# Patient Record
Sex: Female | Born: 1970 | Race: Asian | Hispanic: No | Marital: Married | Smoking: Never smoker
Health system: Southern US, Community
[De-identification: ages and names within clinical notes are randomized; demographics above are authoritative.]

## PROBLEM LIST (undated history)

## (undated) DIAGNOSIS — Z862 Personal history of diseases of the blood and blood-forming organs and certain disorders involving the immune mechanism: Secondary | ICD-10-CM

## (undated) DIAGNOSIS — I10 Essential (primary) hypertension: Secondary | ICD-10-CM

## (undated) DIAGNOSIS — Z8619 Personal history of other infectious and parasitic diseases: Secondary | ICD-10-CM

## (undated) HISTORY — DX: Personal history of other infectious and parasitic diseases: Z86.19

## (undated) HISTORY — DX: Personal history of diseases of the blood and blood-forming organs and certain disorders involving the immune mechanism: Z86.2

## (undated) HISTORY — DX: Essential (primary) hypertension: I10

---

## 2003-09-25 ENCOUNTER — Other Ambulatory Visit: Admission: RE | Admit: 2003-09-25 | Discharge: 2003-09-25 | Payer: Self-pay | Admitting: Family Medicine

## 2003-10-06 ENCOUNTER — Encounter: Admission: RE | Admit: 2003-10-06 | Discharge: 2003-10-06 | Payer: Self-pay | Admitting: Family Medicine

## 2007-11-30 ENCOUNTER — Ambulatory Visit: Payer: Self-pay | Admitting: Obstetrics and Gynecology

## 2007-11-30 ENCOUNTER — Encounter: Payer: Self-pay | Admitting: Obstetrics and Gynecology

## 2007-12-13 ENCOUNTER — Encounter: Admission: RE | Admit: 2007-12-13 | Discharge: 2007-12-13 | Payer: Self-pay | Admitting: Gynecology

## 2008-07-04 ENCOUNTER — Encounter: Admission: RE | Admit: 2008-07-04 | Discharge: 2008-07-04 | Payer: Self-pay | Admitting: Obstetrics and Gynecology

## 2008-08-03 ENCOUNTER — Inpatient Hospital Stay (HOSPITAL_COMMUNITY): Admission: AD | Admit: 2008-08-03 | Discharge: 2008-08-03 | Payer: Self-pay | Admitting: Obstetrics and Gynecology

## 2008-08-04 ENCOUNTER — Inpatient Hospital Stay (HOSPITAL_COMMUNITY): Admission: AD | Admit: 2008-08-04 | Discharge: 2008-08-05 | Payer: Self-pay | Admitting: Obstetrics and Gynecology

## 2010-06-02 ENCOUNTER — Encounter: Payer: Self-pay | Admitting: Family Medicine

## 2010-08-22 LAB — RPR: RPR Ser Ql: NONREACTIVE

## 2010-08-22 LAB — CBC
Hemoglobin: 9 g/dL — ABNORMAL LOW (ref 12.0–15.0)
MCHC: 33.3 g/dL (ref 30.0–36.0)
Platelets: 123 10*3/uL — ABNORMAL LOW (ref 150–400)
Platelets: 148 10*3/uL — ABNORMAL LOW (ref 150–400)
RDW: 15 % (ref 11.5–15.5)
RDW: 15.2 % (ref 11.5–15.5)
WBC: 10.2 10*3/uL (ref 4.0–10.5)

## 2010-09-24 NOTE — Assessment & Plan Note (Signed)
NAME:  Sandra Huffman, Sandra Huffman                     ACCOUNT NO.:  [id number]   MEDICAL RECORD NO.:  [id number]          PATIENT TYPE:  POB   LOCATION:  [facility]         FACILITY:  [facility]   PHYSICIAN:  Argentina Donovan, MD        DATE OF BIRTH:  07-26-1970   DATE OF SERVICE:  11/30/2007                                  CLINIC NOTE   The patient is around 40 year old [name] female (the dates are not  certain because the father gave an earlier date when they came to the  [place] in order that she might go to school).  Gravida 1, para 1-  0-0-1, who had a daughter 48 years old who passed away last year.  Three  years ago, she had a small right breast nodule and had an ultrasound at  [facility] at which time she was reassured.  She returns today with a complaint of another breast nodule that she  thinks she feels in the right breast.  In addition, she has not had a  Pap smear in many years.  She has no significant complaints.  Her  periods are mildly heavy and for that she is taking iron, and she had  her last CBC about 6 months ago with her general doctor.   On examination of the breasts, they are symmetrical.  No nipple  discharge.  However, on the right approximately at 2 o'clock just  adjacent to the areola of the right nipple, there seems to be a slight  palpable, not discrete change in the consistency of the tissue perhaps  1.5 cm in diameter.  It is difficult for me to even say this is an  abnormality.  The abdomen is soft, flat, and nontender.  No masses.  No  organomegaly.  Genitalia, externally is normal.  BUS within normal  limits.  Vagina is clean and well rugated.  Cervix is clean and parous  and the uterus is small, anterior, and of normal size, shape, and  consistency with normal adnexa.   I am going to have the patient get another mammogram; it has been 3  years since her last one, so they can compare that to the present one.  I have told her at  the present time, I do not think she has anything  significant; however, I would certainly check it at least once a month  probably in the shower or as soon as  she got out.   IMPRESSION:  Normal gynecological examination with the exception of the  aforementioned finding in the right breast.           ______________________________  Argentina Donovan, MD     PR/MEDQ  D:  11/30/2007  T:  12/01/2007  Job:  [id number]

## 2010-09-27 NOTE — Discharge Summary (Signed)
NAME:  Sandra Huffman, Sandra Huffman                     ACCOUNT NO.:  [id number]   MEDICAL RECORD NO.:  [id number]          PATIENT TYPE:  INP   LOCATION:  9109                          FACILITY:  [facility]   PHYSICIAN:  Zenaida Niece, M.D.DATE OF BIRTH:  06-20-1970   DATE OF ADMISSION:  08/04/2008  DATE OF DISCHARGE:  08/05/2008                               DISCHARGE SUMMARY   ADMISSION DIAGNOSES:  1. Intrauterine pregnancy at 38+ weeks.  2. Gestational diabetes.   DISCHARGE DIAGNOSES:  1. Intrauterine pregnancy at 38+ weeks.  2. Gestational diabetes.   PROCEDURES:  On August 04, 2008, Dr. Ellyn Hack performed a spontaneous  vaginal delivery.   HISTORY AND PHYSICAL:  This is a 40 year old gravida 3, para 1-0-1-0  with an EGA of 38+ weeks who presents complaining of regular  contractions.  Prenatal care complicated by advanced maternal age and  diet-controlled gestational diabetes.   PRENATAL LABORATORIES:  Blood type is B positive with negative antibody  screen.  Gonorrhea  and chlamydia negative.  RPR nonreactive.  Rubella  immune.  Hepatitis B surface antigen negative.  HIV negative.  Cystic  fibrosis negative.  One-hour Glucola 198.  Three-hour GTT 72, 26, 170,  and 138.  First trimester screen and MSAFP are normal and group B strep  is negative.   PAST OB HISTORY:  Vaginal delivery at term in 1995 and that child  suicide 3 years ago, 1 spontaneous abortion.   PAST MEDICAL HISTORY:  History of depression and anemia.   PHYSICAL EXAMINATION:  She is afebrile with stable vital signs.  Abdomen  soft.  Fundus is nontender.  Fetal heart rate is in the 130s with  contractions every 4 minutes.  Cervix on Dr. Emeline Darling first exam is 7,  90, 0 to +1, and membranes were ruptured revealing clear fluid.   HOSPITAL COURSE:  The patient was admitted and continued to contract on  her own.  She had membranes ruptured for augmentation.  She continued to  progress to complete, pushed well, and on the morning of  August 04, 2008,  had a vaginal delivery of a viable female infant with Apgars of 3, 6,  and 9 that weighed 6 pounds and 9 ounces.  There was a loose nuchal cord  x1.  Placenta was expressed intact.  She had a second-degree perineal  laceration repaired with 3-0 Vicryl and estimated blood loss was less  than 500 mL.  Postpartum, she had no significant problems.  Pre-delivery  hemoglobin 10.9, post-delivery 9.0.  On postpartum day #1, the patient  requested discharge home.  The baby was felt to be stable, so she was  discharged home that afternoon.  She did receive the Tdap vaccine prior  to discharge.   DISCHARGE INSTRUCTIONS:  Regular diet, pelvic rest, follow up in 6  weeks.   MEDICATIONS:  Percocet #20, 1-2 p.o. q.4-6 h. p.r.n. pain and over-the-  counter ibuprofen as needed, and she is given our discharge pamphlet.      Zenaida Niece, M.D.  Electronically Signed     TDM/MEDQ  D:  08/16/2008  T:  08/16/2008  Job:  [id number]

## 2011-06-30 ENCOUNTER — Other Ambulatory Visit: Payer: Self-pay | Admitting: Family Medicine

## 2011-06-30 DIAGNOSIS — Z1231 Encounter for screening mammogram for malignant neoplasm of breast: Secondary | ICD-10-CM

## 2011-07-01 ENCOUNTER — Encounter: Payer: Self-pay | Admitting: Family Medicine

## 2011-07-01 ENCOUNTER — Ambulatory Visit (INDEPENDENT_AMBULATORY_CARE_PROVIDER_SITE_OTHER): Payer: BC Managed Care – PPO | Admitting: Family Medicine

## 2011-07-01 DIAGNOSIS — D649 Anemia, unspecified: Secondary | ICD-10-CM | POA: Insufficient documentation

## 2011-07-01 DIAGNOSIS — R079 Chest pain, unspecified: Secondary | ICD-10-CM | POA: Insufficient documentation

## 2011-07-01 DIAGNOSIS — Z862 Personal history of diseases of the blood and blood-forming organs and certain disorders involving the immune mechanism: Secondary | ICD-10-CM

## 2011-07-01 NOTE — Assessment & Plan Note (Addendum)
Unclear etiology. Does not sound cardiac in nature.  However doesn't sound MSK either as not reproducible on exam today.  Currently no significant pain. ?if due to lifting 40yo daughter (MSK). Check EKG today to get baseline, r/o arrhythmia although regular rhythm on exam. Will request records from [facility] as pt states recently had blood work done there to check anemia and iron and normal. has mammogram scheduled for next month, well woman with OBGYN. If unable to attain records, will have pt return for FLP, CMP, CBC and iron panel.  H/o IDA. EKG - NSR rate 70, normal axis, intervals, no hypertrophy or acute ST/T changes.  one ~1.24 sec pause (<2 sec).

## 2011-07-01 NOTE — Assessment & Plan Note (Addendum)
Review recent records/labs

## 2011-07-01 NOTE — Patient Instructions (Addendum)
I will request records from [facility] to review.  We will call you if we want any other blood work.   EKG today. Good to meet you today, call us with questions. We'll keep an eye on this, please return if worsening or not improving as expected. Try tylenol for discomfort.  Ensure staying well hydrated with plenty of fluid.

## 2011-07-01 NOTE — Progress Notes (Signed)
Subjective:    Patient ID: Sandra Huffman, female    DOB: Sep 29, 1970, 41 y.o.   MRN: [id number]  HPI CC: new pt, establish  Went to [facility] last month for chest pain.  cxr obtained and told normal.  Treated with zpack.  Tends to lift 3 yo daughter above left shoulder.  Pain described as soreness throughout chest associated with mild sharp pains intermittently with deep breaths as well as right arm soreness.  Staying sore for last 3 months.  No h/o asthma.  Not exhertional, not relieved by rest.  No recent viral infections.  No nausea or diaphoresis with this.  No HA, dizziness, heart palpitations or tachycardia associated with this.  May have felt like this when had low iron anemia in past.  H/o IDA.  Stopped iron 3 yrs ago.  BP up today. No h/o HTN.  No fmhx colon or prostate cancer or breast cancer.  No fmhx heart problems.  Preventative: Well woman with OBGYN at [facility].  Normal pap.  Has appt for mammogram next month. Last blood work was 1-2 mo ago at [facility]. Flu 201. Tetanus unsure. LMP - 06/24/2011, regular.  Not on OCP.  Not heavy bleeding.  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  Past histories reviewed and updated if relevant as below. There is no problem list on file for this patient.  Past Medical History  Diagnosis Date  . History of chicken pox   . History of iron deficiency anemia    No past surgical history on file. History  Substance Use Topics  . Smoking status: Never Smoker   . Smokeless tobacco: Never Used  . Alcohol Use: No   Family History  Problem Relation Age of Onset  . Cancer Mother 7    lung, chewing tobacco  . Hyperlipidemia Father   . Coronary artery disease Neg Hx   . Stroke Neg Hx   . Diabetes Neg Hx    No Known Allergies No current outpatient prescriptions on  file prior to visit.   Review of Systems  Constitutional: Negative for fever, chills, activity change, appetite change, fatigue and unexpected weight change.  HENT: Negative for hearing loss and neck pain.   Eyes: Negative for visual disturbance.  Respiratory: Negative for cough, chest tightness, shortness of breath and wheezing.   Cardiovascular: Positive for chest pain. Negative for palpitations and leg swelling.  Gastrointestinal: Positive for diarrhea. Negative for nausea, vomiting, abdominal pain, constipation, blood in stool and abdominal distention.  Genitourinary: Negative for hematuria and difficulty urinating.  Musculoskeletal: Negative for myalgias and arthralgias.  Skin: Negative for rash.  Neurological: Negative for dizziness, seizures, syncope and headaches.  Hematological: Does not bruise/bleed easily.  Psychiatric/Behavioral: Negative for dysphoric mood. The patient is not nervous/anxious.        Objective:   Physical Exam  Nursing note and vitals reviewed. Constitutional: She is oriented to person, place, and time. She appears well-developed and well-nourished. No distress.  HENT:  Head: Normocephalic and atraumatic.  Right Ear: Hearing, tympanic membrane, external ear and ear canal normal.  Left Ear: Hearing, tympanic membrane, external ear and ear canal normal.  Nose: Nose normal. No mucosal edema or rhinorrhea.  Mouth/Throat: Uvula is midline, oropharynx is clear and moist and mucous membranes are normal. No oropharyngeal exudate, posterior oropharyngeal edema, posterior oropharyngeal erythema or  tonsillar abscesses.  Eyes: Conjunctivae and EOM are normal. Pupils are equal, round, and reactive to light.  Neck: Normal range of motion. Neck supple. No thyromegaly present.  Cardiovascular: Normal rate, regular rhythm, normal heart sounds and intact distal pulses.   No murmur heard. Pulses:      Radial pulses are 2+ on the right side, and 2+ on the left side.    Pulmonary/Chest: Effort normal and breath sounds normal. No respiratory distress. She has no wheezes. She has no rales. She exhibits no mass, no tenderness and no bony tenderness.       Pain not reproducible with palpation.  Abdominal: Soft. Bowel sounds are normal. She exhibits no distension and no mass. There is no tenderness. There is no rebound and no guarding.  Musculoskeletal: Normal range of motion. She exhibits no edema.  Lymphadenopathy:    She has no cervical adenopathy.  Neurological: She is alert and oriented to person, place, and time.       CN grossly intact, station and gait intact  Skin: Skin is warm and dry. No rash noted.  Psychiatric: She has a normal mood and affect. Her behavior is normal. Judgment and thought content normal.      Assessment & Plan:

## 2011-07-09 ENCOUNTER — Ambulatory Visit (INDEPENDENT_AMBULATORY_CARE_PROVIDER_SITE_OTHER): Payer: BC Managed Care – PPO | Admitting: Family Medicine

## 2011-07-09 ENCOUNTER — Encounter: Payer: Self-pay | Admitting: Family Medicine

## 2011-07-09 ENCOUNTER — Telehealth: Payer: Self-pay | Admitting: Radiology

## 2011-07-09 ENCOUNTER — Telehealth: Payer: Self-pay | Admitting: *Deleted

## 2011-07-09 VITALS — BP 160/100 | HR 84 | Temp 98.2°F | Wt 133.0 lb

## 2011-07-09 DIAGNOSIS — I160 Hypertensive urgency: Secondary | ICD-10-CM | POA: Insufficient documentation

## 2011-07-09 DIAGNOSIS — I10 Essential (primary) hypertension: Secondary | ICD-10-CM

## 2011-07-09 DIAGNOSIS — J029 Acute pharyngitis, unspecified: Secondary | ICD-10-CM | POA: Insufficient documentation

## 2011-07-09 LAB — BASIC METABOLIC PANEL
BUN: 7 mg/dL (ref 6–23)
CO2: 25 mEq/L (ref 19–32)
Calcium: 8.6 mg/dL (ref 8.4–10.5)
Chloride: 105 mEq/L (ref 96–112)
GFR: 115.43 mL/min (ref >60.00)
Sodium: 136 mEq/L (ref 135–145)

## 2011-07-09 MED ORDER — POTASSIUM CHLORIDE CRYS ER 20 MEQ PO TBCR: EXTENDED_RELEASE_TABLET | ORAL | Status: DC

## 2011-07-09 MED ORDER — HYDROCHLOROTHIAZIDE 12.5 MG PO TABS: 12.5000 mg | ORAL_TABLET | Freq: Every day | ORAL | Status: DC

## 2011-07-09 MED ORDER — SPIRONOLACTONE 25 MG PO TABS: 25.0000 mg | ORAL_TABLET | Freq: Every day | ORAL | Status: DC

## 2011-07-09 NOTE — Telephone Encounter (Signed)
See result note.  

## 2011-07-09 NOTE — Telephone Encounter (Signed)
I called in 10 of the 20 meq's because I couldn't find 40 meq's in the list of choices.

## 2011-07-09 NOTE — Patient Instructions (Signed)
Great to see you. I think you have a virus- use Ibuprofen 400-600 mg every 8 hours as needed for pain. We are starting HCTZ(hydrochlorothiazide) for blood pressure- 1 tablet every morning. Try to cut back on salt intake. Make appointment to see Dr. Reece Agar in 2 weeks.

## 2011-07-09 NOTE — Progress Notes (Signed)
Subjective:    Patient ID: Sandra Huffman, female    DOB: [birth year], 41 y.o.   MRN: [id number]  HPI 41 yo pt of Dr. Reece Agar (established with him last week) here for fever and sore throat.  Pharyngitis- acute onset of sore throat yesterday.  Hurts to swallow.  Had fever last night, T max 102 with body aches.  Afebrile this morning, has not had any Tylenol or Ibuprofen. No known sick contacts.  Has a HA this morning.  No abdominal pain, nausea or vomiting.  HTN- BP very elevated today and was elevated but not as high at last office visit. BP Readings from Last 3 Encounters:  07/09/11 160/100  07/01/11 146/100  Did have CP when she established last week but that has resolved (EKG reassuring at last OV). Has frontal HA but no blurred vision. Admits to salty diet and pos FH of HTN in her father. Has never taken any antihypertensives.  Patient Active Problem List  Diagnoses  . Chest pain  . History of iron deficiency anemia  . HTN (hypertension)  . Pharyngitis   Past Medical History  Diagnosis Date  . History of chicken pox   . History of iron deficiency anemia    No past surgical history on file. History  Substance Use Topics  . Smoking status: Never Smoker   . Smokeless tobacco: Never Used  . Alcohol Use: No   Family History  Problem Relation Age of Onset  . Cancer Mother 22    lung, chewing tobacco  . Hyperlipidemia Father   . Coronary artery disease Neg Hx   . Stroke Neg Hx   . Diabetes Neg Hx    No Known Allergies No current outpatient prescriptions on file prior to visit.   Review of Systems  See HPI No CP, no SOB. Does have a HA No blurred vision      Objective:   Physical Exam  BP 160/100  Pulse 84  Temp(Src) 98.2 F (36.8 C) (Oral)  Wt 133 lb (60.328 kg)  LMP 06/22/2011  Constitutional: She is oriented to person, place, and time. She appears well-developed and well-nourished. No distress.  HENT:  Head: Normocephalic and atraumatic.  Right Ear: Hearing,  tympanic membrane, external ear and ear canal normal.  Left Ear: Hearing, tympanic membrane, external ear and ear canal normal.  Nose: Nose normal. No mucosal edema or rhinorrhea.  Mouth/Throat: Uvula is midline, oropharynx is clear and moist and mucous membranes are normal. + erythema, No oropharyngeal exudate, posterior oropharyngeal edema, posterior oropharyngeal erythema or tonsillar abscesses.  Eyes: Conjunctivae and EOM are normal. Pupils are equal, round, and reactive to light.  Neck: Normal range of motion. Neck supple. No thyromegaly present.  Cardiovascular: Normal rate, regular rhythm, normal heart sounds and intact distal pulses.   No murmur heard. Lymphadenopathy:    She has no cervical adenopathy.  Skin: Skin is warm and dry. No rash noted.  Psychiatric: She has a normal mood and affect. Her behavior is normal. Judgment and thought content normal.      Assessment & Plan:   1. HTN (hypertension)  New- has not taken any decongestants. May be a bit more elevated due to acute illness but given that it was elevated last week as well, will go ahead and start HCTZ 12.5 mg daily and check BMET.  Follow up with Dr. Reece Agar in 2 weeks.  Discussed low salt diet. Basic Metabolic Panel  2. Pharyngitis  New- rapid strep neg. Likely viral and advised  supportive care- see pt instructions for details.

## 2011-07-09 NOTE — Telephone Encounter (Signed)
Dr. Dayton Martes, how many potassium tabs to you want to give patient?

## 2011-07-09 NOTE — Telephone Encounter (Signed)
Let's give her 5 but only take for 3 days prior to recheck of BMet on Friday.  Sorry I did not specify. Thanks!

## 2011-07-09 NOTE — Telephone Encounter (Signed)
[facility] called a critical K+ 2.7

## 2011-07-11 ENCOUNTER — Other Ambulatory Visit: Payer: BC Managed Care – PPO

## 2011-07-11 DIAGNOSIS — E876 Hypokalemia: Secondary | ICD-10-CM

## 2011-07-11 LAB — BASIC METABOLIC PANEL
CO2: 25 mEq/L (ref 19–32)
Calcium: 8.7 mg/dL (ref 8.4–10.5)
Creat: 0.51 mg/dL (ref 0.50–1.10)
Potassium: 3.5 mEq/L (ref 3.5–5.3)
Sodium: 138 mEq/L (ref 135–145)

## 2011-07-25 ENCOUNTER — Ambulatory Visit (INDEPENDENT_AMBULATORY_CARE_PROVIDER_SITE_OTHER): Payer: BC Managed Care – PPO | Admitting: Family Medicine

## 2011-07-25 ENCOUNTER — Encounter: Payer: Self-pay | Admitting: Family Medicine

## 2011-07-25 VITALS — BP 150/100 | HR 80 | Temp 98.1°F | Ht 60.0 in | Wt 132.8 lb

## 2011-07-25 DIAGNOSIS — I10 Essential (primary) hypertension: Secondary | ICD-10-CM

## 2011-07-25 MED ORDER — LISINOPRIL 20 MG PO TABS: 20.0000 mg | ORAL_TABLET | Freq: Every day | ORAL | Status: DC

## 2011-07-25 NOTE — Patient Instructions (Signed)
Please stop taking your spironolactone. Start taking Lisinopril 20 mg daily. Please come back in 2 weeks to recheck your blood pressure.

## 2011-07-25 NOTE — Progress Notes (Signed)
Subjective:    Patient ID: Sandra Huffman, female    DOB: 05/08/1971, 41 y.o.   MRN: [id number]  HPI 41 yo pt of Dr. Reece Agar whom I saw two weeks ago and diagnosed with HTN here for follow up.  We initially started her on HCTZ but BMET drawn and potassium 2.7 so was switched to spironolactone 25 mg. Potassium repleted with kdur 40 meq x 3 days. Lab Results  Component Value Date   NA 138 07/11/2011   K 3.5 07/11/2011   CL 105 07/11/2011   CO2 25 07/11/2011    BP remains very high.  No noticible side effects with spironolactone.   Patient Active Problem List  Diagnoses  . Chest pain  . History of iron deficiency anemia  . HTN (hypertension)  . Pharyngitis   Past Medical History  Diagnosis Date  . History of chicken pox   . History of iron deficiency anemia    No past surgical history on file. History  Substance Use Topics  . Smoking status: Never Smoker   . Smokeless tobacco: Never Used  . Alcohol Use: No   Family History  Problem Relation Age of Onset  . Cancer Mother 80    lung, chewing tobacco  . Hyperlipidemia Father   . Coronary artery disease Neg Hx   . Stroke Neg Hx   . Diabetes Neg Hx    No Known Allergies Current Outpatient Prescriptions on File Prior to Visit  Medication Sig Dispense Refill  . hydrochlorothiazide (HYDRODIURIL) 12.5 MG tablet Take 1 tablet (12.5 mg total) by mouth daily.  30 tablet  0  . potassium chloride SA (K-DUR,KLOR-CON) 20 MEQ tablet Take 2 by mouth daily.  10 tablet  0  . spironolactone (ALDACTONE) 25 MG tablet Take 1 tablet (25 mg total) by mouth daily.  30 tablet  0   Review of Systems  See HPI No CP, no SOB. Does have a HA No blurred vision      Objective:   Physical Exam  LMP 06/22/2011 BP 150/100  Pulse 80  Temp(Src) 98.1 F (36.7 C) (Oral)  Ht 5' (1.524 m)  Wt 132 lb 12.8 oz (60.238 kg)  BMI 25.94 kg/m2  SpO2 98%  LMP 06/22/2011 BP Readings from Last 3 Encounters:  07/25/11 150/100  07/09/11 160/100  07/01/11 146/100     Constitutional: She is oriented to person, place, and time. She appears well-developed and well-nourished. No distress.  HENT:  Head: Normocephalic and atraumatic.  Right Ear: Hearing, tympanic membrane, external ear and ear canal normal.  Left Ear: Hearing, tympanic membrane, external ear and ear canal normal.  Nose: Nose normal. No mucosal edema or rhinorrhea.  Mouth/Throat: Uvula is midline, oropharynx is clear and moist and mucous membranes are normal. + erythema, No oropharyngeal exudate, posterior oropharyngeal edema, posterior oropharyngeal erythema or tonsillar abscesses.  Eyes: Conjunctivae and EOM are normal. Pupils are equal, round, and reactive to light.  Neck: Normal range of motion. Neck supple. No thyromegaly present.  Cardiovascular: Normal rate, regular rhythm, normal heart sounds and intact distal pulses.   No murmur heard. Lymphadenopathy:    She has no cervical adenopathy.  Skin: Skin is warm and dry. No rash noted.  Psychiatric: She has a normal mood and affect. Her behavior is normal. Judgment and thought content normal.      Assessment & Plan:    1. HTN (hypertension)  Basic metabolic panel   Remains poorly controlled. >25 min spent with face to face with patient, >  50% counseling and/or coordinating care. I would prefer not to increase spirolactone given side effects and her limited response to current dose. Cannot tolerate HCTZ due to hypokalemia. Will d/c spironolactone, start lisinopril 20 mg daily(does not want to have more children). Follow up in 2 weeks.

## 2011-07-26 LAB — BASIC METABOLIC PANEL
CO2: 24 mEq/L (ref 19–32)
Calcium: 8.9 mg/dL (ref 8.4–10.5)
Creat: 0.74 mg/dL (ref 0.50–1.10)
Glucose, Bld: 83 mg/dL (ref 70–99)
Sodium: 137 mEq/L (ref 135–145)

## 2011-08-04 ENCOUNTER — Ambulatory Visit: Payer: Self-pay

## 2011-08-07 ENCOUNTER — Ambulatory Visit
Admission: RE | Admit: 2011-08-07 | Discharge: 2011-08-07 | Disposition: A | Discharge status: Home / Self Care | Payer: BC Managed Care – PPO | Source: Ambulatory Visit | Attending: Family Medicine | Admitting: Family Medicine

## 2011-08-07 DIAGNOSIS — Z1231 Encounter for screening mammogram for malignant neoplasm of breast: Secondary | ICD-10-CM

## 2011-08-11 ENCOUNTER — Other Ambulatory Visit: Payer: Self-pay | Admitting: Family Medicine

## 2011-08-11 DIAGNOSIS — R928 Other abnormal and inconclusive findings on diagnostic imaging of breast: Secondary | ICD-10-CM

## 2011-08-14 ENCOUNTER — Ambulatory Visit (INDEPENDENT_AMBULATORY_CARE_PROVIDER_SITE_OTHER): Payer: BC Managed Care – PPO | Admitting: Family Medicine

## 2011-08-14 ENCOUNTER — Encounter: Payer: Self-pay | Admitting: Family Medicine

## 2011-08-14 VITALS — BP 120/80 | HR 80 | Temp 97.9°F | Wt 136.0 lb

## 2011-08-14 DIAGNOSIS — I10 Essential (primary) hypertension: Secondary | ICD-10-CM

## 2011-08-14 DIAGNOSIS — E876 Hypokalemia: Secondary | ICD-10-CM

## 2011-08-14 NOTE — Progress Notes (Signed)
  Subjective:    Patient ID: Sandra Huffman, female    DOB: 15-Aug-1970, 41 y.o.   MRN: [id number]  HPI 41 yo pt of Dr. Reece Agar here for follow up HTN.  We initially started her on HCTZ but BMET drawn and potassium 2.7 so was switched to spironolactone 25 mg. Potassium repleted with kdur 40 meq x 3 days but BP was not responsive to spironolactone.  D/c's spironolactone and now taking Lisinopril 20 mg daily.  Lab Results  Component Value Date   NA 137 07/25/2011   K 3.9 07/25/2011   CL 102 07/25/2011   CO2 24 07/25/2011    Feels much better!  No CP, No HA, no SOB. No side effects.   Patient Active Problem List  Diagnoses  . Chest pain  . History of iron deficiency anemia  . HTN (hypertension)  . Pharyngitis  . Hypokalemia   Past Medical History  Diagnosis Date  . History of chicken pox   . History of iron deficiency anemia    No past surgical history on file. History  Substance Use Topics  . Smoking status: Never Smoker   . Smokeless tobacco: Never Used  . Alcohol Use: No   Family History  Problem Relation Age of Onset  . Cancer Mother 51    lung, chewing tobacco  . Hyperlipidemia Father   . Coronary artery disease Neg Hx   . Stroke Neg Hx   . Diabetes Neg Hx    No Known Allergies Current Outpatient Prescriptions on File Prior to Visit  Medication Sig Dispense Refill  . lisinopril (PRINIVIL,ZESTRIL) 20 MG tablet Take 1 tablet (20 mg total) by mouth daily.  30 tablet  6  . DISCONTD: hydrochlorothiazide (HYDRODIURIL) 12.5 MG tablet Take 1 tablet (12.5 mg total) by mouth daily.  30 tablet  0  . DISCONTD: potassium chloride SA (K-DUR,KLOR-CON) 20 MEQ tablet Take 2 by mouth daily.  10 tablet  0  . DISCONTD: spironolactone (ALDACTONE) 25 MG tablet Take 1 tablet (25 mg total) by mouth daily.  30 tablet  0   Review of Systems  See HPI No CP, no SOB. Does have a HA No blurred vision      Objective:   Physical Exam   BP 120/80  Pulse 80  Temp(Src) 97.9 F (36.6 C) (Oral)   Wt 136 lb (61.689 kg)  BP Readings from Last 3 Encounters:  07/25/11 150/100  07/09/11 160/100  07/01/11 146/100    Constitutional: She is oriented to person, place, and time. She appears well-developed and well-nourished. No distress.  HENT:  Head: Normocephalic and atraumatic.  Eyes: Conjunctivae and EOM are normal. Pupils are equal, round, and reactive to light.  Neck: Normal range of motion. Neck supple. No thyromegaly present.  Cardiovascular: Normal rate, regular rhythm, normal heart sounds and intact distal pulses.   No murmur heard. Lymphadenopathy:    She has no cervical adenopathy.  Skin: Skin is warm and dry. No rash noted.  Psychiatric: She has a normal mood and affect. Her behavior is normal. Judgment and thought content normal.      Assessment & Plan:    1. HTN (hypertension)    Much improved! Continue current dose of lisinopril. Follow up as needed. The patient indicates understanding of these issues and agrees with the plan.

## 2011-08-19 ENCOUNTER — Ambulatory Visit
Admission: RE | Admit: 2011-08-19 | Discharge: 2011-08-19 | Disposition: A | Discharge status: Home / Self Care | Payer: BC Managed Care – PPO | Source: Ambulatory Visit | Attending: Family Medicine | Admitting: Family Medicine

## 2011-08-19 DIAGNOSIS — R928 Other abnormal and inconclusive findings on diagnostic imaging of breast: Secondary | ICD-10-CM

## 2011-08-20 ENCOUNTER — Encounter: Payer: Self-pay | Admitting: *Deleted

## 2011-09-29 ENCOUNTER — Encounter: Payer: Self-pay | Admitting: Family Medicine

## 2011-09-29 ENCOUNTER — Ambulatory Visit (INDEPENDENT_AMBULATORY_CARE_PROVIDER_SITE_OTHER): Payer: BC Managed Care – PPO | Admitting: Family Medicine

## 2011-09-29 VITALS — BP 100/70 | HR 92 | Temp 98.3°F | Wt 133.0 lb

## 2011-09-29 DIAGNOSIS — I10 Essential (primary) hypertension: Secondary | ICD-10-CM

## 2011-09-29 DIAGNOSIS — R05 Cough: Secondary | ICD-10-CM

## 2011-09-29 DIAGNOSIS — R059 Cough, unspecified: Secondary | ICD-10-CM | POA: Insufficient documentation

## 2011-09-29 MED ORDER — AMLODIPINE BESYLATE 5 MG PO TABS: 5.0000 mg | ORAL_TABLET | Freq: Every day | ORAL | Status: DC

## 2011-09-29 NOTE — Progress Notes (Signed)
Subjective:    Patient ID: Sandra Huffman, female    DOB: [birth year], 41 y.o.   MRN: [id number]  HPI 41 yo pt of Dr. Reece Agar with h/o HTN here for cough.  Cough has been ongoing for at least a month.  At first she thought maybe it was allergies but there is no runny nose, congestion, itchy eyes. Claritin is not helping. No CP, wheezing or SOB.  HTN- We initially started her on HCTZ but BMET drawn and potassium 2.7 so was switched to spironolactone 25 mg. Potassium repleted with kdur 40 meq x 3 days but BP was not responsive to spironolactone.  D/c's spironolactone and now taking Lisinopril 20 mg daily since March.  Lab Results  Component Value Date   NA 137 07/25/2011   K 3.9 07/25/2011   CL 102 07/25/2011   CO2 24 07/25/2011       Patient Active Problem List  Diagnoses  . Chest pain  . History of iron deficiency anemia  . HTN (hypertension)  . Pharyngitis  . Hypokalemia  . Cough   Past Medical History  Diagnosis Date  . History of chicken pox   . History of iron deficiency anemia    No past surgical history on file. History  Substance Use Topics  . Smoking status: Never Smoker   . Smokeless tobacco: Never Used  . Alcohol Use: No   Family History  Problem Relation Age of Onset  . Cancer Mother 42    lung, chewing tobacco  . Hyperlipidemia Father   . Coronary artery disease Neg Hx   . Stroke Neg Hx   . Diabetes Neg Hx    No Known Allergies Current Outpatient Prescriptions on File Prior to Visit  Medication Sig Dispense Refill  . lisinopril (PRINIVIL,ZESTRIL) 20 MG tablet Take 1 tablet (20 mg total) by mouth daily.  30 tablet  6  . DISCONTD: hydrochlorothiazide (HYDRODIURIL) 12.5 MG tablet Take 1 tablet (12.5 mg total) by mouth daily.  30 tablet  0  . DISCONTD: potassium chloride SA (K-DUR,KLOR-CON) 20 MEQ tablet Take 2 by mouth daily.  10 tablet  0  . DISCONTD: spironolactone (ALDACTONE) 25 MG tablet Take 1 tablet (25 mg total) by mouth daily.  30 tablet  0   Review of  Systems  See HPI No CP, no SOB. Does have a HA No blurred vision      Objective:   Physical Exam   BP 100/70  Pulse 92  Temp(Src) 98.3 F (36.8 C) (Oral)  Wt 133 lb (60.328 kg)  LMP 09/19/2011  BP Readings from Last 3 Encounters:  08/14/11 120/80  07/25/11 150/100  07/09/11 160/100    Constitutional: She is oriented to person, place, and time. She appears well-developed and well-nourished. No distress.  HENT:  Head: Normocephalic and atraumatic.  Eyes: Conjunctivae and EOM are normal. Pupils are equal, round, and reactive to light.  Neck: Normal range of motion. Neck supple. No thyromegaly present.  Cardiovascular: Normal rate, regular rhythm, normal heart sounds and intact distal pulses.   No murmur heard. Lymphadenopathy:    She has no cervical adenopathy.  Skin: Skin is warm and dry. No rash noted.  Psychiatric: She has a normal mood and affect. Her behavior is normal. Judgment and thought content normal.      Assessment & Plan:   1. Cough  New- persistent- ? From lisinopril since she has no other symptoms of allergic rhinitis. Will d/c lisinopril. Start amlodipine 5 mg daily. Follow up in  2 weeks.  2. HTN (hypertension)  See above.

## 2011-09-29 NOTE — Patient Instructions (Signed)
Good to see you. I think the cough is from the lisinopril--stop taking it. Start taking amlodipine 5 mg daily. Follow up with me in 2 weeks.

## 2011-10-17 ENCOUNTER — Encounter: Payer: Self-pay | Admitting: Family Medicine

## 2011-10-17 ENCOUNTER — Ambulatory Visit (INDEPENDENT_AMBULATORY_CARE_PROVIDER_SITE_OTHER): Payer: BC Managed Care – PPO | Admitting: Family Medicine

## 2011-10-17 VITALS — BP 138/90 | HR 76 | Temp 98.0°F | Wt 134.0 lb

## 2011-10-17 DIAGNOSIS — I10 Essential (primary) hypertension: Secondary | ICD-10-CM

## 2011-10-17 NOTE — Progress Notes (Signed)
Subjective:    Patient ID: Sandra Huffman, female    DOB: Apr 09, 1971, 41 y.o.   MRN: [id number]  HPI 41 yo pt of Dr. Reece Agar with h/o HTN.   Developed a cough with lisinopril that had been ongoing for at least a month.  At first she thought maybe it was allergies but there is no runny nose, congestion, itchy eyes. Claritin is not helping. No CP, wheezing or SOB.  We initially started her on HCTZ but BMET drawn and potassium 2.7 so was switched to spironolactone 25 mg. Potassium repleted with kdur 40 meq x 3 days but BP was not responsive to spironolactone.  D/c's spironolactone and started lisinopril.  Due to cough, lisinopril was d/c'd and added to allergy list.  She has been taking amlodipine 5 mg daily for past 2 weeks. Cough has resolved and no adverse reactions noted. No LE edema.  Lab Results  Component Value Date   NA 137 07/25/2011   K 3.9 07/25/2011   CL 102 07/25/2011   CO2 24 07/25/2011       Patient Active Problem List  Diagnoses  . Chest pain  . History of iron deficiency anemia  . HTN (hypertension)  . Pharyngitis  . Hypokalemia  . Cough   Past Medical History  Diagnosis Date  . History of chicken pox   . History of iron deficiency anemia    No past surgical history on file. History  Substance Use Topics  . Smoking status: Never Smoker   . Smokeless tobacco: Never Used  . Alcohol Use: No   Family History  Problem Relation Age of Onset  . Cancer Mother 73    lung, chewing tobacco  . Hyperlipidemia Father   . Coronary artery disease Neg Hx   . Stroke Neg Hx   . Diabetes Neg Hx    Allergies  Allergen Reactions  . Ace Inhibitors     cough   Current Outpatient Prescriptions on File Prior to Visit  Medication Sig Dispense Refill  . amLODipine (NORVASC) 5 MG tablet Take 1 tablet (5 mg total) by mouth daily.  90 tablet  3  . DISCONTD: hydrochlorothiazide (HYDRODIURIL) 12.5 MG tablet Take 1 tablet (12.5 mg total) by mouth daily.  30 tablet  0  . DISCONTD:  potassium chloride SA (K-DUR,KLOR-CON) 20 MEQ tablet Take 2 by mouth daily.  10 tablet  0  . DISCONTD: spironolactone (ALDACTONE) 25 MG tablet Take 1 tablet (25 mg total) by mouth daily.  30 tablet  0   Review of Systems  See HPI No CP, no SOB. No blurred vision      Objective:   Physical Exam  BP 138/90  Pulse 76  Temp 98 F (36.7 C)  Wt 134 lb (60.782 kg)  LMP 09/19/2011  Constitutional: She is oriented to person, place, and time. She appears well-developed and well-nourished. No distress.  HENT:  Head: Normocephalic and atraumatic.  Eyes: Conjunctivae and EOM are normal. Pupils are equal, round, and reactive to light.  Neck: Normal range of motion. Neck supple. No thyromegaly present.  Cardiovascular: Normal rate, regular rhythm, normal heart sounds and intact distal pulses.   No murmur heard. Lymphadenopathy:    She has no cervical adenopathy.  Skin: Skin is warm and dry. No rash noted.  Psychiatric: She has a normal mood and affect. Her behavior is normal. Judgment and thought content normal.      Assessment & Plan:   1. HTN (hypertension)   Improved. Continue  amlodipine 5 mg daily. The patient indicates understanding of these issues and agrees with the plan.

## 2012-01-20 ENCOUNTER — Ambulatory Visit (INDEPENDENT_AMBULATORY_CARE_PROVIDER_SITE_OTHER): Payer: BC Managed Care – PPO | Admitting: Family Medicine

## 2012-01-20 ENCOUNTER — Encounter: Payer: Self-pay | Admitting: Family Medicine

## 2012-01-20 VITALS — BP 142/90 | Temp 98.2°F | Wt 133.2 lb

## 2012-01-20 DIAGNOSIS — R0989 Other specified symptoms and signs involving the circulatory and respiratory systems: Secondary | ICD-10-CM

## 2012-01-20 DIAGNOSIS — Z862 Personal history of diseases of the blood and blood-forming organs and certain disorders involving the immune mechanism: Secondary | ICD-10-CM

## 2012-01-20 DIAGNOSIS — I10 Essential (primary) hypertension: Secondary | ICD-10-CM

## 2012-01-20 DIAGNOSIS — R209 Unspecified disturbances of skin sensation: Secondary | ICD-10-CM

## 2012-01-20 DIAGNOSIS — R202 Paresthesia of skin: Secondary | ICD-10-CM | POA: Insufficient documentation

## 2012-01-20 DIAGNOSIS — R0600 Dyspnea, unspecified: Secondary | ICD-10-CM | POA: Insufficient documentation

## 2012-01-20 DIAGNOSIS — R0602 Shortness of breath: Secondary | ICD-10-CM

## 2012-01-20 NOTE — Patient Instructions (Addendum)
Lets try taking amlodipine in the morning. Buy a blood pressure cuff and keep track of blood pressures, especially if having shortness of breath.   Goal blood pressure is < 140/90.  If consistently staying elevated, let us know and we would likely increase amlodipine. Blood work today to check on thyroid and blood counts. Stay well hydrated and get plenty of fruits and vegetables - high [place] diet handout provided today.

## 2012-01-20 NOTE — Assessment & Plan Note (Signed)
Premenopausal, continued menstruation. Check CBC and iron panel (anemia could cause SOB)

## 2012-01-20 NOTE — Progress Notes (Signed)
  Subjective:    Patient ID: Sandra Huffman, female    DOB: 04/08/1971, 41 y.o.   MRN: [id number]  HPI CC: numbness and SOB at night.  Takes aleve prn HA about 3x/mo.  Takes amlodipine nightly, tolerating well.  Tried in AM but sometimes forgets to take.  See prior notes for recent antiHTNsive trials.  Has noted occasional SOB worse at night.  Described as trouble catching breath occasionally.  Wonders if due to taking BP med at night.  Does not have cuff at home.  No chest pain/tightness or throat pain.  No coughing, fevers/chills.   No dizziness. No prolonged travel.  No personal or fmhx blood clots.  Not on hormonal meds.  Feeling some tingling in right lower leg.  Tingling noted anterior lower thigh and leg as well as some laterally.  Never in feet or toes.  Occasional cramping in calf and right foot.  No falls/injury to leg.  No lower back pain.  No radiculopathy.  No bowel/bladder changes.  No chances of pregnancy. LMP - 2d ago.  Normal periods, regular.  3 days heavy periods.goes through 4-5 pads/day. H/o IDA, last check Hgb ~10.  Past Medical History  Diagnosis Date  . History of chicken pox   . History of iron deficiency anemia   . HTN (hypertension)      Review of Systems Per HPI    Objective   Physical Exam  Nursing note and vitals reviewed. Constitutional: She appears well-developed and well-nourished. No distress.  HENT:  Head: Normocephalic and atraumatic.  Mouth/Throat: Oropharynx is clear and moist. No oropharyngeal exudate.  Eyes: Conjunctivae and EOM are normal. Pupils are equal, round, and reactive to light.  Neck: Normal range of motion. Neck supple. Carotid bruit is not present. No thyromegaly present.  Cardiovascular: Normal rate, regular rhythm, normal heart sounds and intact distal pulses.   No murmur heard. Pulmonary/Chest: Effort normal and breath sounds normal. No respiratory distress. She has no wheezes. She has no rales.  Musculoskeletal: She exhibits no  edema.       No calf tenderness. 2+ DP/PT bilaterally. No cramping or altered sensation noted on exam today.  Lymphadenopathy:    She has no cervical adenopathy.  Skin: Skin is warm and dry. No rash noted. No pallor.  Psychiatric: She has a normal mood and affect.       Assessment & Plan:

## 2012-01-20 NOTE — Assessment & Plan Note (Signed)
Check CBC, TSH. Normal cardiac, pulmonary exam.  Normal o2 sat.

## 2012-01-20 NOTE — Assessment & Plan Note (Signed)
Check B12. Isolated intermittent LLE numbness - exam normal.  Update if worsening or not improving.  No radicular sxs.

## 2012-01-20 NOTE — Assessment & Plan Note (Signed)
Slightly elevated BP today. I wonder if her BP is staying elevated at home leading to some sxs at night as she describes.  Advised start with buying cuff to monitor BP at home, and change amlodipine to AM dosing (hopeful for good control at night time). If elevated bp at home, would likely recommend increase amlodipine to 10mg  daily. Will also check TSH today.

## 2012-01-21 LAB — CBC WITH DIFFERENTIAL/PLATELET
Hemoglobin: 10.5 g/dL — ABNORMAL LOW (ref 12.0–15.0)
Lymphs Abs: 2 10*3/uL (ref 0.7–4.0)
Monocytes Absolute: 0.4 10*3/uL (ref 0.1–1.0)
Monocytes Relative: 5.6 % (ref 3.0–12.0)
Neutro Abs: 4.9 10*3/uL (ref 1.4–7.7)
RDW: 15.5 % — ABNORMAL HIGH (ref 11.5–14.6)

## 2012-01-21 LAB — VITAMIN B12: Vitamin B-12: 532 pg/mL (ref 211–911)

## 2012-01-21 NOTE — Addendum Note (Signed)
Addended by: Alvina Chou on: 01/21/2012 09:11 AM   Modules accepted: Orders

## 2012-01-22 LAB — BASIC METABOLIC PANEL
Calcium: 9 mg/dL (ref 8.4–10.5)
Chloride: 108 mEq/L (ref 96–112)
Creatinine, Ser: 0.7 mg/dL (ref 0.4–1.2)
GFR: 106.98 mL/min (ref >60.00)
Glucose, Bld: 79 mg/dL (ref 70–99)
Potassium: 4 mEq/L (ref 3.5–5.1)

## 2012-01-22 LAB — IBC PANEL
Iron: 18 ug/dL — ABNORMAL LOW (ref 42–145)
Saturation Ratios: 4 % — ABNORMAL LOW (ref 20.0–50.0)
Transferrin: 317.9 mg/dL (ref 212.0–360.0)

## 2012-01-23 ENCOUNTER — Other Ambulatory Visit: Payer: Self-pay | Admitting: Family Medicine

## 2012-01-23 MED ORDER — FERROUS SULFATE 325 (65 FE) MG PO TABS: 325.0000 mg | ORAL_TABLET | Freq: Two times a day (BID) | ORAL | Status: DC

## 2012-02-23 ENCOUNTER — Ambulatory Visit: Payer: BC Managed Care – PPO | Admitting: Family Medicine

## 2012-08-24 ENCOUNTER — Other Ambulatory Visit: Payer: Self-pay

## 2012-08-24 DIAGNOSIS — Z1231 Encounter for screening mammogram for malignant neoplasm of breast: Secondary | ICD-10-CM

## 2012-09-01 ENCOUNTER — Other Ambulatory Visit: Payer: Self-pay | Admitting: *Deleted

## 2012-09-01 ENCOUNTER — Ambulatory Visit
Admission: RE | Admit: 2012-09-01 | Discharge: 2012-09-01 | Disposition: A | Discharge status: Home / Self Care | Payer: BC Managed Care – PPO | Source: Ambulatory Visit

## 2012-09-01 DIAGNOSIS — Z1231 Encounter for screening mammogram for malignant neoplasm of breast: Secondary | ICD-10-CM

## 2012-09-01 MED ORDER — AMLODIPINE BESYLATE 5 MG PO TABS: 5.0000 mg | ORAL_TABLET | Freq: Every day | ORAL | Status: DC

## 2012-10-07 ENCOUNTER — Other Ambulatory Visit: Payer: Self-pay | Admitting: Family Medicine

## 2012-10-08 NOTE — Telephone Encounter (Signed)
Pt called for status of refill amlodipine; advised pt to ck with [facility] [address]

## 2013-02-17 ENCOUNTER — Telehealth: Payer: Self-pay | Admitting: Family Medicine

## 2013-02-17 MED ORDER — AMLODIPINE BESYLATE 5 MG PO TABS: 5.0000 mg | ORAL_TABLET | Freq: Every day | ORAL | Status: DC

## 2013-02-17 NOTE — Telephone Encounter (Signed)
Norvasc refilled.

## 2013-02-17 NOTE — Telephone Encounter (Signed)
Pt is needing refill on her b/p meds. She says she forgot the name of medication when I asked her which one ?? She states that she uses [facility] on [address]. Pt scheduled CPE in January when Dr. Dayton Martes returns

## 2013-06-02 ENCOUNTER — Encounter: Payer: Self-pay | Admitting: Family Medicine

## 2013-06-02 ENCOUNTER — Other Ambulatory Visit (HOSPITAL_COMMUNITY)
Admission: RE | Admit: 2013-06-02 | Discharge: 2013-06-02 | Disposition: A | Discharge status: Home / Self Care | Payer: BC Managed Care – PPO | Source: Ambulatory Visit | Attending: Family Medicine | Admitting: Family Medicine

## 2013-06-02 ENCOUNTER — Ambulatory Visit (INDEPENDENT_AMBULATORY_CARE_PROVIDER_SITE_OTHER): Payer: BC Managed Care – PPO | Admitting: Family Medicine

## 2013-06-02 VITALS — BP 130/70 | HR 89 | Temp 97.9°F | Ht 61.0 in | Wt 135.0 lb

## 2013-06-02 DIAGNOSIS — Z01419 Encounter for gynecological examination (general) (routine) without abnormal findings: Secondary | ICD-10-CM | POA: Insufficient documentation

## 2013-06-02 DIAGNOSIS — Z136 Encounter for screening for cardiovascular disorders: Secondary | ICD-10-CM

## 2013-06-02 DIAGNOSIS — Z0000 Encounter for general adult medical examination without abnormal findings: Secondary | ICD-10-CM

## 2013-06-02 DIAGNOSIS — I10 Essential (primary) hypertension: Secondary | ICD-10-CM

## 2013-06-02 DIAGNOSIS — Z1151 Encounter for screening for human papillomavirus (HPV): Secondary | ICD-10-CM | POA: Insufficient documentation

## 2013-06-02 DIAGNOSIS — Z862 Personal history of diseases of the blood and blood-forming organs and certain disorders involving the immune mechanism: Secondary | ICD-10-CM

## 2013-06-02 DIAGNOSIS — E876 Hypokalemia: Secondary | ICD-10-CM

## 2013-06-02 LAB — COMPREHENSIVE METABOLIC PANEL
ALK PHOS: 64 U/L (ref 39–117)
ALT: 31 U/L (ref 0–35)
AST: 31 U/L (ref 0–37)
Albumin: 3.5 g/dL (ref 3.5–5.2)
BILIRUBIN TOTAL: 0.4 mg/dL (ref 0.3–1.2)
BUN: 11 mg/dL (ref 6–23)
CALCIUM: 8.6 mg/dL (ref 8.4–10.5)
CHLORIDE: 105 meq/L (ref 96–112)
CO2: 27 mEq/L (ref 19–32)
CREATININE: 0.5 mg/dL (ref 0.4–1.2)
GFR: 137.47 mL/min (ref >60.00)
Glucose, Bld: 65 mg/dL — ABNORMAL LOW (ref 70–99)
Potassium: 3.1 mEq/L — ABNORMAL LOW (ref 3.5–5.1)
Sodium: 138 mEq/L (ref 135–145)
Total Protein: 7.7 g/dL (ref 6.0–8.3)

## 2013-06-02 LAB — LIPID PANEL
CHOL/HDL RATIO: 4
Cholesterol: 204 mg/dL — ABNORMAL HIGH (ref 0–200)
HDL: 53.5 mg/dL (ref >39.00)
TRIGLYCERIDES: 218 mg/dL — AB (ref 0.0–149.0)
VLDL: 43.6 mg/dL — AB (ref 0.0–40.0)

## 2013-06-02 LAB — CBC WITH DIFFERENTIAL/PLATELET
BASOS PCT: 0.5 % (ref 0.0–3.0)
Basophils Absolute: 0 10*3/uL (ref 0.0–0.1)
EOS PCT: 5.3 % — AB (ref 0.0–5.0)
Eosinophils Absolute: 0.4 10*3/uL (ref 0.0–0.7)
HCT: 34.8 % — ABNORMAL LOW (ref 36.0–46.0)
Hemoglobin: 11.4 g/dL — ABNORMAL LOW (ref 12.0–15.0)
LYMPHS PCT: 26.7 % (ref 12.0–46.0)
Lymphs Abs: 1.9 10*3/uL (ref 0.7–4.0)
MCHC: 32.7 g/dL (ref 30.0–36.0)
MCV: 72 fl — AB (ref 78.0–100.0)
MONO ABS: 0.6 10*3/uL (ref 0.1–1.0)
MONOS PCT: 8.7 % (ref 3.0–12.0)
NEUTROS PCT: 58.8 % (ref 43.0–77.0)
Neutro Abs: 4.1 10*3/uL (ref 1.4–7.7)
PLATELETS: 261 10*3/uL (ref 150.0–400.0)
RBC: 4.84 Mil/uL (ref 3.87–5.11)
RDW: 15.5 % — ABNORMAL HIGH (ref 11.5–14.6)
WBC: 7 10*3/uL (ref 4.5–10.5)

## 2013-06-02 LAB — LDL CHOLESTEROL, DIRECT: LDL DIRECT: 124.1 mg/dL

## 2013-06-02 LAB — TSH: TSH: 1.63 u[IU]/mL (ref 0.35–5.50)

## 2013-06-02 NOTE — Progress Notes (Signed)
Subjective:    Patient ID: Sandra Huffman, female    DOB: 12/23/1970, 43 y.o.   MRN: [id number]  HPI  Very pleasant G1P1 here for CPX/pap smear.  One abnormal pap smear after daughter was born who is 5. Cannot had remember when last pap smear was.  Last mammogram normal 08/24/2012.  No family history of breast, uterine or cervical CA.  HTN- BP well controlled on amlodipine 5 mg daily. Denies any CP, SOB, LE edema or visual changes.  Due for labs today.  Does have h/o iron deficiency anemia and has not been taking iron.  Patient Active Problem List   Diagnosis Date Noted  . Routine general medical examination at a health care facility 06/02/2013  . Hypokalemia 08/14/2011  . HTN (hypertension) 07/09/2011  . History of iron deficiency anemia    Past Medical History  Diagnosis Date  . History of chicken pox   . History of iron deficiency anemia   . HTN (hypertension)    No past surgical history on file. History  Substance Use Topics  . Smoking status: Never Smoker   . Smokeless tobacco: Never Used  . Alcohol Use: No   Family History  Problem Relation Age of Onset  . Cancer Mother 2473    lung, chewing tobacco  . Hyperlipidemia Father   . Coronary artery disease Neg Hx   . Stroke Neg Hx   . Diabetes Neg Hx    Allergies  Allergen Reactions  . Ace Inhibitors     cough   Current Outpatient Prescriptions on File Prior to Visit  Medication Sig Dispense Refill  . amLODipine (NORVASC) 5 MG tablet Take 1 tablet (5 mg total) by mouth daily.  90 tablet  0  . naproxen sodium (ANAPROX) 220 MG tablet Take 220 mg by mouth as needed.      . ferrous sulfate 325 (65 FE) MG tablet Take 1 tablet (325 mg total) by mouth 2 (two) times daily.  30 tablet  11  . [DISCONTINUED] hydrochlorothiazide (HYDRODIURIL) 12.5 MG tablet Take 1 tablet (12.5 mg total) by mouth daily.  30 tablet  0  . [DISCONTINUED] potassium chloride SA (K-DUR,KLOR-CON) 20 MEQ tablet Take 2 by mouth daily.  10 tablet  0  .  [DISCONTINUED] spironolactone (ALDACTONE) 25 MG tablet Take 1 tablet (25 mg total) by mouth daily.  30 tablet  0   No current facility-administered medications on file prior to visit.   The PMH, PSH, Social History, Family History, Medications, and allergies [facility], and have been updated if relevant.    Review of Systems See HPI Patient reports no  vision/ hearing changes,anorexia, weight change, fever ,adenopathy, persistant / recurrent hoarseness, swallowing issues, chest pain, edema,persistant / recurrent cough, hemoptysis, dyspnea(rest, exertional, paroxysmal nocturnal), gastrointestinal  bleeding (melena, rectal bleeding), abdominal pain, excessive heart burn, GU symptoms(dysuria, hematuria, pyuria, voiding/incontinence  Issues) syncope, focal weakness, severe memory loss, concerning skin lesions, depression, anxiety, abnormal bruising/bleeding, major joint swelling, breast masses or abnormal vaginal bleeding.       Objective:   Physical Exam BP 130/70  Pulse 89  Temp(Src) 97.9 F (36.6 C) (Oral)  Ht 5\' 1"  (1.549 m)  Wt 135 lb (61.236 kg)  BMI 25.52 kg/m2  SpO2 98%  LMP 05/21/2013  General:  Well-developed,well-nourished,in no acute distress; alert,appropriate and cooperative throughout examination Head:  normocephalic and atraumatic.   Eyes:  vision grossly intact, pupils equal, pupils round, and pupils reactive to light.   Ears:  R ear normal and L ear normal.   Nose:  no external deformity.   Mouth:  good dentition.   Neck:  No deformities, masses, or tenderness noted. Breasts:  No mass, nodules, thickening, tenderness, bulging, retraction, inflamation, nipple discharge or skin changes noted.   Lungs:  Normal respiratory effort, chest expands symmetrically. Lungs are clear to auscultation, no crackles or wheezes. Heart:  Normal rate and regular rhythm. S1 and S2 normal without gallop, murmur, click, rub or other extra sounds. Abdomen:  Bowel sounds  positive,abdomen soft and non-tender without masses, organomegaly or hernias noted. Rectal:  no external abnormalities.   Genitalia:  Pelvic Exam:        External: normal female genitalia without lesions or masses        Vagina: normal without lesions or masses        Cervix: normal without lesions or masses        Adnexa: normal bimanual exam without masses or fullness        Uterus: normal by palpation        Pap smear: performed Msk:  No deformity or scoliosis noted of thoracic or lumbar spine.   Extremities:  No clubbing, cyanosis, edema, or deformity noted with normal full range of motion of all joints.   Neurologic:  alert & oriented X3 and gait normal.   Skin:  Intact without suspicious lesions or rashes Cervical Nodes:  No lymphadenopathy noted Axillary Nodes:  No palpable lymphadenopathy Psych:  Cognition and judgment appear intact. Alert and cooperative with normal attention span and concentration. No apparent delusions, illusions, hallucinations        Assessment & Plan:

## 2013-06-02 NOTE — Assessment & Plan Note (Signed)
CBC today.  

## 2013-06-02 NOTE — Assessment & Plan Note (Signed)
Stable on low dose amlodipine. No changes.

## 2013-06-02 NOTE — Patient Instructions (Signed)
Good to see you. We will call you with your lab results and you can view them online.  Please set up your mammogram after August 24, 2013.

## 2013-06-02 NOTE — Assessment & Plan Note (Signed)
Reviewed preventive care protocols, scheduled due services, and updated immunizations Discussed nutrition, exercise, diet, and healthy lifestyle.  

## 2013-06-02 NOTE — Progress Notes (Signed)
Pre-visit discussion using our clinic review tool. No additional management support is needed unless otherwise documented below in the visit note.  

## 2013-06-02 NOTE — Assessment & Plan Note (Signed)
Pap smear today. She will set up mammogram after April 2015.

## 2013-06-02 NOTE — Addendum Note (Signed)
Addended by: Desmond DikeKNIGHT, Hosam Mcfetridge H on: 06/02/2013 10:00 AM   Modules accepted: Orders

## 2013-06-08 ENCOUNTER — Encounter: Payer: Self-pay | Admitting: *Deleted

## 2013-06-15 ENCOUNTER — Telehealth: Payer: Self-pay | Admitting: Family Medicine

## 2013-06-15 NOTE — Telephone Encounter (Signed)
Relevant patient education mailed to patient.  

## 2013-08-03 ENCOUNTER — Other Ambulatory Visit: Payer: Self-pay

## 2013-08-03 DIAGNOSIS — Z1231 Encounter for screening mammogram for malignant neoplasm of breast: Secondary | ICD-10-CM

## 2013-09-02 ENCOUNTER — Ambulatory Visit
Admission: RE | Admit: 2013-09-02 | Discharge: 2013-09-02 | Disposition: A | Discharge status: Home / Self Care | Payer: BC Managed Care – PPO | Source: Ambulatory Visit

## 2013-09-02 DIAGNOSIS — Z1231 Encounter for screening mammogram for malignant neoplasm of breast: Secondary | ICD-10-CM

## 2013-10-23 ENCOUNTER — Other Ambulatory Visit: Payer: Self-pay | Admitting: Family Medicine

## 2013-11-03 ENCOUNTER — Encounter: Payer: Self-pay | Admitting: Internal Medicine

## 2013-11-03 ENCOUNTER — Ambulatory Visit (INDEPENDENT_AMBULATORY_CARE_PROVIDER_SITE_OTHER): Payer: BC Managed Care – PPO | Admitting: Internal Medicine

## 2013-11-03 VITALS — BP 130/86 | HR 88 | Temp 98.0°F | Wt 135.0 lb

## 2013-11-03 DIAGNOSIS — R609 Edema, unspecified: Secondary | ICD-10-CM

## 2013-11-03 DIAGNOSIS — I10 Essential (primary) hypertension: Secondary | ICD-10-CM

## 2013-11-03 MED ORDER — AMLODIPINE BESYLATE 5 MG PO TABS: 5.0000 mg | ORAL_TABLET | Freq: Every day | ORAL | Status: DC

## 2013-11-03 NOTE — Patient Instructions (Signed)

## 2013-11-03 NOTE — Progress Notes (Signed)
Pre visit review using our clinic review tool, if applicable. No additional management support is needed unless otherwise documented below in the visit note. 

## 2013-11-03 NOTE — Progress Notes (Signed)
Subjective:    Patient ID: Sandra Huffman, female    DOB: 11/28/1970, 43 y.o.   MRN: [id number]  HPI  Pt presents to the clinic today with c/o bilateral leg swelling. She reports this started 2 weeks ago. It is intermittent. It seems to be worse at night. It gets better in the morning. She denies pain or numbness in her legs. She has had some tinginling in her right leg. 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 She denies any specific injury to the area. She is on Norvasc for her BP.  Review of Systems      Past Medical History  Diagnosis Date  . History of chicken pox   . History of iron deficiency anemia   . HTN (hypertension)     Current Outpatient Prescriptions  Medication Sig Dispense Refill  . amLODipine (NORVASC) 5 MG tablet Take 1 tablet (5 mg total) by mouth daily.  90 tablet  0  . naproxen sodium (ANAPROX) 220 MG tablet Take 220 mg by mouth as needed.      . [DISCONTINUED] hydrochlorothiazide (HYDRODIURIL) 12.5 MG tablet Take 1 tablet (12.5 mg total) by mouth daily.  30 tablet  0  . [DISCONTINUED] potassium chloride SA (K-DUR,KLOR-CON) 20 MEQ tablet Take 2 by mouth daily.  10 tablet  0  . [DISCONTINUED] spironolactone (ALDACTONE) 25 MG tablet Take 1 tablet (25 mg total) by mouth daily.  30 tablet  0   No current facility-administered medications for this visit.    Allergies  Allergen Reactions  . Ace Inhibitors     cough    Family History  Problem Relation Age of Onset  . Cancer Mother 10573    lung, chewing tobacco  . Hyperlipidemia Father   . Coronary artery disease Neg Hx   . Stroke Neg Hx   . Diabetes Neg Hx     History   Social History  . Marital Status: Married    Spouse Name: N/A    Number of Children: N/A  . Years of Education: N/A   Occupational History  . Not on file.   Social History Main Topics  . Smoking status: Never Smoker   . Smokeless tobacco: Never Used  . Alcohol Use:  No  . Drug Use: No  . Sexual Activity: Not on file   Other Topics Concern  . Not on file   Social History Narrative   Caffeine: none   Lives with husband and daughter (2010), no pets, 42 yo grown daughter   Occupation: sewer at [employer]   Edu: HS   Activity: no regular exercise     Constitutional: Denies fever, malaise, fatigue, headache or abrupt weight changes.  Respiratory: Denies difficulty breathing, shortness of breath, cough or sputum production.   Cardiovascular: Pt reports BLE edema. Denies chest pain, chest tightness, palpitations or swelling in the hands.   No other specific complaints in a complete review of systems (except as listed in HPI above).  Objective:   Physical Exam  BP 130/86  Pulse 88  Temp(Src) 98 F (36.7 C) (Oral)  Wt 135 lb (61.236 kg)  SpO2 98% Wt Readings from Last 3 Encounters:  11/03/13 135 lb (61.236 kg)  06/02/13 135 lb (61.236 kg)  01/20/12 133 lb 4 oz (60.442 kg)    General: Appears her stated age, well developed, well nourished in NAD. Cardiovascular: Normal rate and rhythm. S1,S2  noted.  No murmur, rubs or gallops noted. No JVD or BLE edema. No carotid bruits noted. Pulmonary/Chest: Normal effort and positive vesicular breath sounds. No respiratory distress. No wheezes, rales or ronchi noted.   BMET    Component Value Date/Time   NA 138 06/02/2013 0959   K 3.1* 06/02/2013 0959   CL 105 06/02/2013 0959   CO2 27 06/02/2013 0959   GLUCOSE 65* 06/02/2013 0959   BUN 11 06/02/2013 0959   CREATININE 0.5 06/02/2013 0959   CREATININE 0.74 07/25/2011 1601   CALCIUM 8.6 06/02/2013 0959    Lipid Panel     Component Value Date/Time   CHOL 204* 06/02/2013 0959   TRIG 218.0* 06/02/2013 0959   HDL 53.50 06/02/2013 0959   CHOLHDL 4 06/02/2013 0959   VLDL 43.6* 06/02/2013 0959    CBC    Component Value Date/Time   WBC 7.0 06/02/2013 0959   RBC 4.84 06/02/2013 0959   HGB 11.4* 06/02/2013 0959   HCT 34.8* 06/02/2013 0959   PLT 261.0  06/02/2013 0959   MCV 72.0* 06/02/2013 0959   MCHC 32.7 06/02/2013 0959   RDW 15.5* 06/02/2013 0959   LYMPHSABS 1.9 06/02/2013 0959   MONOABS 0.6 06/02/2013 0959   EOSABS 0.4 06/02/2013 0959   BASOSABS 0.0 06/02/2013 0959    Hgb A1C No results found for this basename: HGBA1C         Assessment & Plan:   Peripheral Edema:  I think this is dependant edema from sitting all day long Also think the RLE tingling is from repetitive motions with the sewing machine pedal Advised her to try compression hose and elevating her legs when she gets home If if continues to bother her or becomes painful, can follow up with PCP. Of note, she has been on HCTZ in the past and it did not control her BP very well  RTC as needed or if symptoms persist or worsen

## 2013-11-21 ENCOUNTER — Encounter: Payer: Self-pay | Admitting: Internal Medicine

## 2013-11-21 ENCOUNTER — Ambulatory Visit (INDEPENDENT_AMBULATORY_CARE_PROVIDER_SITE_OTHER): Payer: BC Managed Care – PPO | Admitting: Internal Medicine

## 2013-11-21 VITALS — BP 122/72 | HR 102 | Temp 98.4°F | Wt 134.0 lb

## 2013-11-21 DIAGNOSIS — G44209 Tension-type headache, unspecified, not intractable: Secondary | ICD-10-CM

## 2013-11-21 NOTE — Progress Notes (Signed)
Pre visit review using our clinic review tool, if applicable. No additional management support is needed unless otherwise documented below in the visit note. 

## 2013-11-21 NOTE — Progress Notes (Signed)
Subjective:    Patient ID: Sandra Huffman, female    DOB: 02/23/1971, 43 y.o.   MRN: [id number]  HPI  Pt presents to the clinic today with c/o a headache. She reports this started last night. The headache is across her forehead. She describes it as a squeezing sensation. She denies nausea, vomiting or blurred vision. She has had a history of migraines in the past about 5 years ago. She reports this does not feel the same. She has tried Aleve OTC with some relief. She has been under a lot more stress lately.  Review of Systems      Past Medical History  Diagnosis Date  . History of chicken pox   . History of iron deficiency anemia   . HTN (hypertension)     Current Outpatient Prescriptions  Medication Sig Dispense Refill  . amLODipine (NORVASC) 5 MG tablet Take 1 tablet (5 mg total) by mouth daily.  30 tablet  3  . naproxen sodium (ANAPROX) 220 MG tablet Take 220 mg by mouth as needed.      . [DISCONTINUED] hydrochlorothiazide (HYDRODIURIL) 12.5 MG tablet Take 1 tablet (12.5 mg total) by mouth daily.  30 tablet  0  . [DISCONTINUED] potassium chloride SA (K-DUR,KLOR-CON) 20 MEQ tablet Take 2 by mouth daily.  10 tablet  0  . [DISCONTINUED] spironolactone (ALDACTONE) 25 MG tablet Take 1 tablet (25 mg total) by mouth daily.  30 tablet  0   No current facility-administered medications for this visit.    Allergies  Allergen Reactions  . Ace Inhibitors     cough    Family History  Problem Relation Age of Onset  . Cancer Mother 37    lung, chewing tobacco  . Hyperlipidemia Father   . Coronary artery disease Neg Hx   . Stroke Neg Hx   . Diabetes Neg Hx     History   Social History  . Marital Status: Married    Spouse Name: N/A    Number of Children: N/A  . Years of Education: N/A   Occupational History  . Not on file.   Social History Main Topics  . Smoking status: Never Smoker   . Smokeless tobacco: Never Used  . Alcohol Use: No  . Drug Use: No  . Sexual Activity: Not  on file   Other Topics Concern  . Not on file   Social History Narrative   Caffeine: none   Lives with husband and daughter (2010), no pets, 52 yo grown daughter   Occupation: sewer at [employer]   Edu: HS   Activity: no regular exercise     Constitutional: Pt reports headache. Denies fever, malaise, fatigue, or abrupt weight changes.  HEENT: Denies eye pain, eye redness, ear pain, ringing in the ears, wax buildup, runny nose, nasal congestion, bloody nose, or sore throat. Neurological: Denies dizziness, difficulty with memory, difficulty with speech or problems with balance and coordination.   No other specific complaints in a complete review of systems (except as listed in HPI above).  Objective:   Physical Exam   BP 122/72  Pulse 102  Temp(Src) 98.4 F (36.9 C) (Oral)  Wt 134 lb (60.782 kg)  SpO2 98% Wt Readings from Last 3 Encounters:  11/21/13 134 lb (60.782 kg)  11/03/13 135 lb (61.236 kg)  06/02/13 135 lb (61.236 kg)    General: Appears her stated age, well developed, well nourished in NAD.  Cardiovascular: Normal rate and rhythm. S1,S2 noted.  No murmur,  rubs or gallops noted. No JVD or BLE edema. No carotid bruits noted. Pulmonary/Chest: Normal effort and positive vesicular breath sounds. No respiratory distress. No wheezes, rales or ronchi noted.  Neurological: Alert and oriented. Cranial nerves grossly intact. Coordination normal. +DTRs bilaterally.  BMET    Component Value Date/Time   NA 138 06/02/2013 0959   K 3.1* 06/02/2013 0959   CL 105 06/02/2013 0959   CO2 27 06/02/2013 0959   GLUCOSE 65* 06/02/2013 0959   BUN 11 06/02/2013 0959   CREATININE 0.5 06/02/2013 0959   CREATININE 0.74 07/25/2011 1601   CALCIUM 8.6 06/02/2013 0959    Lipid Panel     Component Value Date/Time   CHOL 204* 06/02/2013 0959   TRIG 218.0* 06/02/2013 0959   HDL 53.50 06/02/2013 0959   CHOLHDL 4 06/02/2013 0959   VLDL 43.6* 06/02/2013 0959    CBC    Component Value  Date/Time   WBC 7.0 06/02/2013 0959   RBC 4.84 06/02/2013 0959   HGB 11.4* 06/02/2013 0959   HCT 34.8* 06/02/2013 0959   PLT 261.0 06/02/2013 0959   MCV 72.0* 06/02/2013 0959   MCHC 32.7 06/02/2013 0959   RDW 15.5* 06/02/2013 0959   LYMPHSABS 1.9 06/02/2013 0959   MONOABS 0.6 06/02/2013 0959   EOSABS 0.4 06/02/2013 0959   BASOSABS 0.0 06/02/2013 0959    Hgb A1C No results found for this basename: HGBA1C        Assessment & Plan:   Tension headache:  She does not want an injection of Depo today Advised her to try 800 mg Ibuprofen every 8 hours with food if needed She can also try to get a massage as that may help relieve tension Work note provided  RTC as needed or if symptoms persist or worsen

## 2013-11-21 NOTE — Patient Instructions (Addendum)

## 2014-05-13 ENCOUNTER — Other Ambulatory Visit: Payer: Self-pay | Admitting: Internal Medicine

## 2014-07-25 ENCOUNTER — Other Ambulatory Visit: Payer: Self-pay | Admitting: Family Medicine

## 2014-08-07 ENCOUNTER — Other Ambulatory Visit: Payer: Self-pay | Admitting: Family Medicine

## 2014-08-18 ENCOUNTER — Other Ambulatory Visit: Payer: Self-pay

## 2014-08-22 ENCOUNTER — Other Ambulatory Visit: Payer: Self-pay

## 2014-08-22 DIAGNOSIS — Z1231 Encounter for screening mammogram for malignant neoplasm of breast: Secondary | ICD-10-CM

## 2014-09-06 ENCOUNTER — Ambulatory Visit
Admission: RE | Admit: 2014-09-06 | Discharge: 2014-09-06 | Disposition: A | Discharge status: Home / Self Care | Payer: BLUE CROSS/BLUE SHIELD | Source: Ambulatory Visit

## 2014-09-06 DIAGNOSIS — Z1231 Encounter for screening mammogram for malignant neoplasm of breast: Secondary | ICD-10-CM

## 2014-12-07 ENCOUNTER — Encounter: Payer: Self-pay | Admitting: Family Medicine

## 2014-12-07 ENCOUNTER — Ambulatory Visit (INDEPENDENT_AMBULATORY_CARE_PROVIDER_SITE_OTHER): Payer: BLUE CROSS/BLUE SHIELD | Admitting: Family Medicine

## 2014-12-07 VITALS — BP 138/82 | HR 95 | Temp 98.2°F | Wt 136.5 lb

## 2014-12-07 DIAGNOSIS — N6310 Unspecified lump in the right breast, unspecified quadrant: Secondary | ICD-10-CM

## 2014-12-07 DIAGNOSIS — N63 Unspecified lump in breast: Secondary | ICD-10-CM | POA: Diagnosis not present

## 2014-12-07 NOTE — Patient Instructions (Signed)
Good to see you. Please stop by to see Sandra Huffman on your way out. 

## 2014-12-07 NOTE — Progress Notes (Signed)
Subjective:   Patient ID: Sandra Huffman, female    DOB: 03/21/1971, 44 y.o.   MRN: [id number]  Sandra Huffman is a pleasant 44 y.o. year old female who presents to clinic today with right breast mass and  Breast Pain  on 12/07/2014  HPI:  Right breast mass- noticed it months ago, even prior to her negative mammogram in 08/2014. She has always had lumpy breasts so was not too concerned.  Past several weeks it is becoming painful, not just around her period. No changes in her nipples or nipple discharge.  No family history of breast, uterine or cervical CA.  Current Outpatient Prescriptions on File Prior to Visit  Medication Sig Dispense Refill  . amLODipine (NORVASC) 5 MG tablet TAKE 1 TABLET BY MOUTH DAILY 30 tablet 8  . naproxen sodium (ANAPROX) 220 MG tablet Take 220 mg by mouth as needed.    . [DISCONTINUED] hydrochlorothiazide (HYDRODIURIL) 12.5 MG tablet Take 1 tablet (12.5 mg total) by mouth daily. 30 tablet 0  . [DISCONTINUED] potassium chloride SA (K-DUR,KLOR-CON) 20 MEQ tablet Take 2 by mouth daily. 10 tablet 0  . [DISCONTINUED] spironolactone (ALDACTONE) 25 MG tablet Take 1 tablet (25 mg total) by mouth daily. 30 tablet 0   No current facility-administered medications on file prior to visit.    Allergies  Allergen Reactions  . Ace Inhibitors     cough    Past Medical History  Diagnosis Date  . History of chicken pox   . History of iron deficiency anemia   . HTN (hypertension)     No past surgical history on file.  Family History  Problem Relation Age of Onset  . Cancer Mother 16    lung, chewing tobacco  . Hyperlipidemia Father   . Coronary artery disease Neg Hx   . Stroke Neg Hx   . Diabetes Neg Hx     History   Social History  . Marital Status: Married    Spouse Name: N/A  . Number of Children: N/A  . Years of Education: N/A   Occupational History  . Not on file.   Social History Main Topics  . Smoking status: Never Smoker   . Smokeless tobacco: Never Used   . Alcohol Use: No  . Drug Use: No  . Sexual Activity: Not on file   Other Topics Concern  . Not on file   Social History Narrative   Caffeine: none   Lives with husband and daughter (2010), no pets, 53 yo grown daughter   Occupation: sewer at [employer]   Edu: HS   Activity: no regular exercise   The PMH, PSH, Social History, Family History, Medications, and allergies have been reviewed in [facility], and have been updated if relevant.   Review of Systems  Constitutional: Negative.   Respiratory: Negative.   Cardiovascular: Negative.   Gastrointestinal: Negative.   Genitourinary: Negative.   Allergic/Immunologic: Negative.   Neurological: Negative.   Hematological: Negative.   Psychiatric/Behavioral: Negative.   All other systems reviewed and are negative.      Objective:    BP 138/82 mmHg  Pulse 95  Temp(Src) 98.2 F (36.8 C) (Oral)  Wt 136 lb 8 oz (61.916 kg)  SpO2 97%  LMP 11/28/2014   Physical Exam  Constitutional: She is oriented to person, place, and time. She appears well-developed and well-nourished. No distress.  HENT:  Head: Normocephalic.  Eyes: Conjunctivae are normal.  Cardiovascular: Normal rate.   Pulmonary/Chest: Effort normal. Right breast exhibits  mass and tenderness. Right breast exhibits no inverted nipple, no nipple discharge and no skin change.  Right breast mass at 11 o'clock position, TTP  Neurological: She is alert and oriented to person, place, and time. No cranial nerve deficit.  Skin: Skin is warm and dry.  Psychiatric: She has a normal mood and affect. Her behavior is normal. Judgment and thought content normal.          Assessment & Plan:   Breast mass, right - Plan: MM Digital Diagnostic Bilat, US BREAST COMPLETE UNI RIGHT INC AXILLA No Follow-up on file.

## 2014-12-07 NOTE — Assessment & Plan Note (Signed)
New- now painful likely due to frequent manipulation. Refer for diagnostic mammogram and ultrasound for further evaluation. Orders Placed This Encounter  Procedures  . MM Digital Diagnostic Bilat  . US BREAST COMPLETE UNI RIGHT INC AXILLA   The patient indicates understanding of these issues and agrees with the plan.

## 2014-12-07 NOTE — Progress Notes (Signed)
Pre visit review using our clinic review tool, if applicable. No additional management support is needed unless otherwise documented below in the visit note. 

## 2014-12-13 ENCOUNTER — Ambulatory Visit
Admission: RE | Admit: 2014-12-13 | Discharge: 2014-12-13 | Disposition: A | Discharge status: Home / Self Care | Payer: BLUE CROSS/BLUE SHIELD | Source: Ambulatory Visit | Attending: Family Medicine | Admitting: Family Medicine

## 2014-12-13 ENCOUNTER — Other Ambulatory Visit: Payer: Self-pay | Admitting: Family Medicine

## 2014-12-13 ENCOUNTER — Ambulatory Visit
Admission: RE | Admit: 2014-12-13 | Discharge: 2014-12-13 | Disposition: A | Discharge status: Home / Self Care | Payer: BLUE CROSS/BLUE SHIELD | Source: Ambulatory Visit | Attending: Primary Care | Admitting: Primary Care

## 2014-12-13 ENCOUNTER — Other Ambulatory Visit: Payer: Self-pay | Admitting: Primary Care

## 2014-12-13 ENCOUNTER — Telehealth: Payer: Self-pay | Admitting: Primary Care

## 2014-12-13 DIAGNOSIS — N6310 Unspecified lump in the right breast, unspecified quadrant: Secondary | ICD-10-CM

## 2014-12-13 DIAGNOSIS — N6320 Unspecified lump in the left breast, unspecified quadrant: Principal | ICD-10-CM

## 2014-12-13 NOTE — Telephone Encounter (Signed)
Received call from breast center who needs an order for bilateral diagnostic mammogram with bilateral ultrasound due to patient complaint of bilateral breast lumps. Order placed.

## 2014-12-14 ENCOUNTER — Other Ambulatory Visit: Payer: Self-pay | Admitting: Primary Care

## 2014-12-25 ENCOUNTER — Encounter: Payer: BLUE CROSS/BLUE SHIELD | Admitting: Family Medicine

## 2015-01-08 ENCOUNTER — Other Ambulatory Visit (HOSPITAL_COMMUNITY)
Admission: RE | Admit: 2015-01-08 | Discharge: 2015-01-08 | Disposition: A | Discharge status: Home / Self Care | Payer: BLUE CROSS/BLUE SHIELD | Source: Ambulatory Visit | Attending: Family Medicine | Admitting: Family Medicine

## 2015-01-08 ENCOUNTER — Encounter: Payer: Self-pay | Admitting: Family Medicine

## 2015-01-08 ENCOUNTER — Ambulatory Visit (INDEPENDENT_AMBULATORY_CARE_PROVIDER_SITE_OTHER): Payer: BLUE CROSS/BLUE SHIELD | Admitting: Family Medicine

## 2015-01-08 VITALS — BP 118/74 | HR 74 | Temp 98.0°F | Ht 61.0 in | Wt 134.0 lb

## 2015-01-08 DIAGNOSIS — Z1151 Encounter for screening for human papillomavirus (HPV): Secondary | ICD-10-CM | POA: Insufficient documentation

## 2015-01-08 DIAGNOSIS — I10 Essential (primary) hypertension: Secondary | ICD-10-CM

## 2015-01-08 DIAGNOSIS — E876 Hypokalemia: Secondary | ICD-10-CM

## 2015-01-08 DIAGNOSIS — Z01419 Encounter for gynecological examination (general) (routine) without abnormal findings: Secondary | ICD-10-CM | POA: Insufficient documentation

## 2015-01-08 DIAGNOSIS — R7989 Other specified abnormal findings of blood chemistry: Secondary | ICD-10-CM | POA: Diagnosis not present

## 2015-01-08 DIAGNOSIS — Z0000 Encounter for general adult medical examination without abnormal findings: Secondary | ICD-10-CM | POA: Diagnosis not present

## 2015-01-08 DIAGNOSIS — Z862 Personal history of diseases of the blood and blood-forming organs and certain disorders involving the immune mechanism: Secondary | ICD-10-CM | POA: Diagnosis not present

## 2015-01-08 LAB — CBC WITH DIFFERENTIAL/PLATELET
Basophils Absolute: 0 10*3/uL (ref 0.0–0.1)
Basophils Relative: 0.5 % (ref 0.0–3.0)
EOS ABS: 0.5 10*3/uL (ref 0.0–0.7)
Eosinophils Relative: 6.9 % — ABNORMAL HIGH (ref 0.0–5.0)
HCT: 31.5 % — ABNORMAL LOW (ref 36.0–46.0)
Hemoglobin: 9.9 g/dL — ABNORMAL LOW (ref 12.0–15.0)
LYMPHS ABS: 2 10*3/uL (ref 0.7–4.0)
LYMPHS PCT: 25.8 % (ref 12.0–46.0)
MCHC: 31.3 g/dL (ref 30.0–36.0)
MCV: 68.1 fl — ABNORMAL LOW (ref 78.0–100.0)
MONOS PCT: 9.3 % (ref 3.0–12.0)
Monocytes Absolute: 0.7 10*3/uL (ref 0.1–1.0)
NEUTROS ABS: 4.4 10*3/uL (ref 1.4–7.7)
NEUTROS PCT: 57.5 % (ref 43.0–77.0)
PLATELETS: 212 10*3/uL (ref 150.0–400.0)
RBC: 4.62 Mil/uL (ref 3.87–5.11)
RDW: 17 % — ABNORMAL HIGH (ref 11.5–15.5)
WBC: 7.7 10*3/uL (ref 4.0–10.5)

## 2015-01-08 LAB — COMPREHENSIVE METABOLIC PANEL
ALK PHOS: 57 U/L (ref 39–117)
ALT: 24 U/L (ref 0–35)
AST: 25 U/L (ref 0–37)
Albumin: 3.7 g/dL (ref 3.5–5.2)
BILIRUBIN TOTAL: 0.2 mg/dL (ref 0.2–1.2)
BUN: 12 mg/dL (ref 6–23)
CO2: 27 meq/L (ref 19–32)
CREATININE: 0.62 mg/dL (ref 0.40–1.20)
Calcium: 8.7 mg/dL (ref 8.4–10.5)
Chloride: 105 mEq/L (ref 96–112)
GFR: 111.37 mL/min (ref >60.00)
GLUCOSE: 88 mg/dL (ref 70–99)
Potassium: 3.3 mEq/L — ABNORMAL LOW (ref 3.5–5.1)
Sodium: 139 mEq/L (ref 135–145)
TOTAL PROTEIN: 7.5 g/dL (ref 6.0–8.3)

## 2015-01-08 LAB — LIPID PANEL
Cholesterol: 178 mg/dL (ref 0–200)
HDL: 38.4 mg/dL — AB (ref >39.00)
NONHDL: 139.4
Total CHOL/HDL Ratio: 5
Triglycerides: 377 mg/dL — ABNORMAL HIGH (ref 0.0–149.0)
VLDL: 75.4 mg/dL — ABNORMAL HIGH (ref 0.0–40.0)

## 2015-01-08 LAB — TSH: TSH: 3.31 u[IU]/mL (ref 0.35–4.50)

## 2015-01-08 NOTE — Assessment & Plan Note (Signed)
Discussed USPSTF recommendations of cervical cancer screening.  She is aware that interval of 3 years is recommended but pt would prefer to have pap smear done today.  

## 2015-01-08 NOTE — Progress Notes (Signed)
Pre visit review using our clinic review tool, if applicable. No additional management support is needed unless otherwise documented below in the visit note. 

## 2015-01-08 NOTE — Progress Notes (Signed)
Subjective:    Patient ID: Sandra Huffman, female    DOB: 08-12-1970, 44 y.o.   MRN: [id number]  HPI  Very pleasant 44 year odl G1P1 here for CPX/pap smear.  One abnormal pap smear after daughter was born who is 5.  Last pap smear 06/02/13- done by me.  Last mammogram 12/13/14.  No family history of breast, uterine or cervical CA.  HTN- BP well controlled on amlodipine 5 mg daily. Denies any CP, SOB, LE edema or visual changes. Lab Results  Component Value Date   CREATININE 0.5 06/02/2013     Due for labs today.  Does have h/o iron deficiency anemia and has not been taking iron. Lab Results  Component Value Date   WBC 7.0 06/02/2013   HGB 11.4* 06/02/2013   HCT 34.8* 06/02/2013   MCV 72.0* 06/02/2013   PLT 261.0 06/02/2013   Lab Results  Component Value Date   TSH 1.63 06/02/2013   Lab Results  Component Value Date   CHOL 204* 06/02/2013   HDL 53.50 06/02/2013   LDLDIRECT 124.1 06/02/2013   TRIG 218.0* 06/02/2013   CHOLHDL 4 06/02/2013   Lab Results  Component Value Date   ALT 31 06/02/2013   AST 31 06/02/2013   ALKPHOS 64 06/02/2013   BILITOT 0.4 06/02/2013     Patient Active Problem List   Diagnosis Date Noted  . Routine general medical examination at a health care facility 06/02/2013  . Hypokalemia 08/14/2011  . HTN (hypertension) 07/09/2011  . History of iron deficiency anemia    Past Medical History  Diagnosis Date  . History of chicken pox   . History of iron deficiency anemia   . HTN (hypertension)    History reviewed. No pertinent past surgical history. Social History  Substance Use Topics  . Smoking status: Never Smoker   . Smokeless tobacco: Never Used  . Alcohol Use: No   Family History  Problem Relation Age of Onset  . Cancer Mother 58    lung, chewing tobacco  . Hyperlipidemia Father   . Coronary artery disease Neg Hx   . Stroke Neg Hx   . Diabetes Neg Hx    Allergies  Allergen Reactions  . Ace Inhibitors     cough   Current  Outpatient Prescriptions on File Prior to Visit  Medication Sig Dispense Refill  . amLODipine (NORVASC) 5 MG tablet TAKE 1 TABLET BY MOUTH DAILY 30 tablet 8  . naproxen sodium (ANAPROX) 220 MG tablet Take 220 mg by mouth as needed.    . [DISCONTINUED] hydrochlorothiazide (HYDRODIURIL) 12.5 MG tablet Take 1 tablet (12.5 mg total) by mouth daily. 30 tablet 0  . [DISCONTINUED] potassium chloride SA (K-DUR,KLOR-CON) 20 MEQ tablet Take 2 by mouth daily. 10 tablet 0  . [DISCONTINUED] spironolactone (ALDACTONE) 25 MG tablet Take 1 tablet (25 mg total) by mouth daily. 30 tablet 0   No current facility-administered medications on file prior to visit.   The PMH, PSH, Social History, Family History, Medications, and allergies have been reviewed in [facility], and have been updated if relevant.    Review of Systems  Constitutional: Negative.   HENT: Negative.   Eyes: Negative.   Respiratory: Negative.   Cardiovascular: Negative.   Gastrointestinal: Negative.   Endocrine: Negative.   Genitourinary: Negative.   Musculoskeletal: Negative.   Skin: Negative.   Allergic/Immunologic: Negative.   Neurological: Negative.   Hematological: Negative.   Psychiatric/Behavioral: Negative.   All other systems reviewed and are negative.  Objective:   Physical Exam BP 118/74 mmHg  Pulse 74  Temp(Src) 98 F (36.7 C) (Oral)  Ht 5\' 1"  (1.549 m)  Wt 134 lb (60.782 kg)  BMI 25.33 kg/m2  SpO2 98%  LMP 12/20/2014  General:  Well-developed,well-nourished,in no acute distress; alert,appropriate and cooperative throughout examination Head:  normocephalic and atraumatic.   Eyes:  vision grossly intact, pupils equal, pupils round, and pupils reactive to light.   Ears:  R ear normal and L ear normal.   Nose:  no external deformity.   Mouth:  good dentition.   Neck:  No deformities, masses, or tenderness noted. Breasts:  No mass, nodules, thickening, tenderness, bulging, retraction, inflamation, nipple  discharge or skin changes noted.   Lungs:  Normal respiratory effort, chest expands symmetrically. Lungs are clear to auscultation, no crackles or wheezes. Heart:  Normal rate and regular rhythm. S1 and S2 normal without gallop, murmur, click, rub or other extra sounds. Abdomen:  Bowel sounds positive,abdomen soft and non-tender without masses, organomegaly or hernias noted. Rectal:  no external abnormalities.   Genitalia:  Pelvic Exam:        External: normal female genitalia without lesions or masses        Vagina: normal without lesions or masses        Cervix: normal without lesions or masses        Adnexa: normal bimanual exam without masses or fullness        Uterus: normal by palpation        Pap smear: performed Msk:  No deformity or scoliosis noted of thoracic or lumbar spine.   Extremities:  No clubbing, cyanosis, edema, or deformity noted with normal full range of motion of all joints.   Neurologic:  alert & oriented X3 and gait normal.   Skin:  Intact without suspicious lesions or rashes Cervical Nodes:  No lymphadenopathy noted Axillary Nodes:  No palpable lymphadenopathy Psych:  Cognition and judgment appear intact. Alert and cooperative with normal attention span and concentration. No apparent delusions, illusions, hallucinations        Assessment & Plan:

## 2015-01-08 NOTE — Assessment & Plan Note (Signed)
Well controlled on current rx. No changes made. 

## 2015-01-08 NOTE — Patient Instructions (Signed)
Great to see you. We will call you with your lab results and you can view them online.  

## 2015-01-08 NOTE — Assessment & Plan Note (Signed)
Reviewed preventive care protocols, scheduled due services, and updated immunizations Discussed nutrition, exercise, diet, and healthy lifestyle.  Orders Placed This Encounter  Procedures  . CBC with Differential/Platelet  . Comprehensive metabolic panel  . Lipid panel  . TSH     

## 2015-01-08 NOTE — Addendum Note (Signed)
Addended by: Desmond Dike on: 01/08/2015 12:14 PM   Modules accepted: Orders

## 2015-01-09 LAB — LDL CHOLESTEROL, DIRECT: LDL DIRECT: 89 mg/dL

## 2015-01-09 LAB — CYTOLOGY - PAP

## 2015-01-10 ENCOUNTER — Other Ambulatory Visit: Payer: Self-pay | Admitting: *Deleted

## 2015-01-10 DIAGNOSIS — Z862 Personal history of diseases of the blood and blood-forming organs and certain disorders involving the immune mechanism: Secondary | ICD-10-CM

## 2015-01-10 MED ORDER — FERROUS SULFATE 325 (65 FE) MG PO TABS: 325.0000 mg | ORAL_TABLET | Freq: Two times a day (BID) | ORAL | Status: DC

## 2015-06-30 ENCOUNTER — Other Ambulatory Visit: Payer: Self-pay | Admitting: Family Medicine

## 2015-11-06 ENCOUNTER — Encounter: Payer: Self-pay | Admitting: Primary Care

## 2015-11-06 ENCOUNTER — Ambulatory Visit (INDEPENDENT_AMBULATORY_CARE_PROVIDER_SITE_OTHER): Payer: BLUE CROSS/BLUE SHIELD | Admitting: Primary Care

## 2015-11-06 VITALS — BP 146/96 | HR 91 | Temp 98.1°F | Ht 61.0 in | Wt 136.0 lb

## 2015-11-06 DIAGNOSIS — J069 Acute upper respiratory infection, unspecified: Secondary | ICD-10-CM

## 2015-11-06 NOTE — Patient Instructions (Signed)
Your symptoms are caused by seasonal allergies.   Nasal Congestion: Try using Flonase (fluticasone) nasal spray. Instill 2 sprays in each nostril once daily. This may be purchased over the counter.  Sneezing, runny nose: Start Zyrtec (certizine) tablets. These may be purchased over the counter.  Cough: Robitussin or Delsym.  Please notify me if you develop persistent fevers of 101, start coughing up green mucous, notice increased fatigue or weakness, or feel worse after 1 week of onset of symptoms.   Increase consumption of water intake and rest.  It was a pleasure meeting you!  Upper Respiratory Infection, Adult Most upper respiratory infections (URIs) are a viral infection of the air passages leading to the lungs. A URI affects the nose, throat, and upper air passages. The most common type of URI is nasopharyngitis and is typically referred to as "the common cold." URIs run their course and usually go away on their own. Most of the time, a URI does not require medical attention, but sometimes a bacterial infection in the upper airways can follow a viral infection. This is called a secondary infection. Sinus and middle ear infections are common types of secondary upper respiratory infections. Bacterial pneumonia can also complicate a URI. A URI can worsen asthma and chronic obstructive pulmonary disease (COPD). Sometimes, these complications can require emergency medical care and may be life threatening.  CAUSES Almost all URIs are caused by viruses. A virus is a type of germ and can spread from one person to another.  RISKS FACTORS You may be at risk for a URI if:   You smoke.   You have chronic heart or lung disease.  You have a weakened defense (immune) system.   You are very young or very old.   You have nasal allergies or asthma.  You work in crowded or poorly ventilated areas.  You work in health care facilities or schools. SIGNS AND SYMPTOMS  Symptoms typically develop  2-3 days after you come in contact with a cold virus. Most viral URIs last 7-10 days. However, viral URIs from the influenza virus (flu virus) can last 14-18 days and are typically more severe. Symptoms may include:   Runny or stuffy (congested) nose.   Sneezing.   Cough.   Sore throat.   Headache.   Fatigue.   Fever.   Loss of appetite.   Pain in your forehead, behind your eyes, and over your cheekbones (sinus pain).  Muscle aches.  DIAGNOSIS  Your health care provider may diagnose a URI by:  Physical exam.  Tests to check that your symptoms are not due to another condition such as:  Strep throat.  Sinusitis.  Pneumonia.  Asthma. TREATMENT  A URI goes away on its own with time. It cannot be cured with medicines, but medicines may be prescribed or recommended to relieve symptoms. Medicines may help:  Reduce your fever.  Reduce your cough.  Relieve nasal congestion. HOME CARE INSTRUCTIONS   Take medicines only as directed by your health care provider.   Gargle warm saltwater or take cough drops to comfort your throat as directed by your health care provider.  Use a warm mist humidifier or inhale steam from a shower to increase air moisture. This may make it easier to breathe.  Drink enough fluid to keep your urine clear or pale yellow.   Eat soups and other clear broths and maintain good nutrition.   Rest as needed.   Return to work when your temperature has returned to  normal or as your health care provider advises. You may need to stay home longer to avoid infecting others. You can also use a face mask and careful hand washing to prevent spread of the virus.  Increase the usage of your inhaler if you have asthma.   Do not use any tobacco products, including cigarettes, chewing tobacco, or electronic cigarettes. If you need help quitting, ask your health care provider. PREVENTION  The best way to protect yourself from getting a cold is to  practice good hygiene.   Avoid oral or hand contact with people with cold symptoms.   Wash your hands often if contact occurs.  There is no clear evidence that vitamin C, vitamin E, echinacea, or exercise reduces the chance of developing a cold. However, it is always recommended to get plenty of rest, exercise, and practice good nutrition.  SEEK MEDICAL CARE IF:   You are getting worse rather than better.   Your symptoms are not controlled by medicine.   You have chills.  You have worsening shortness of breath.  You have brown or red mucus.  You have yellow or brown nasal discharge.  You have pain in your face, especially when you bend forward.  You have a fever.  You have swollen neck glands.  You have pain while swallowing.  You have white areas in the back of your throat. SEEK IMMEDIATE MEDICAL CARE IF:   You have severe or persistent:  Headache.  Ear pain.  Sinus pain.  Chest pain.  You have chronic lung disease and any of the following:  Wheezing.  Prolonged cough.  Coughing up blood.  A change in your usual mucus.  You have a stiff neck.  You have changes in your:  Vision.  Hearing.  Thinking.  Mood. MAKE SURE YOU:   Understand these instructions.  Will watch your condition.  Will get help right away if you are not doing well or get worse.   This information is not intended to replace advice given to you by your health care provider. Make sure you discuss any questions you have with your health care provider.   Document Released: 10/22/2000 Document Revised: 09/12/2014 Document Reviewed: 08/03/2013 [employer]vier Interactive Patient Education [employer]2016 [employer]

## 2015-11-06 NOTE — Progress Notes (Signed)
Pre visit review using our clinic review tool, if applicable. No additional management support is needed unless otherwise documented below in the visit note. 

## 2015-11-06 NOTE — Progress Notes (Signed)
Subjective:    Patient ID: Sandra Huffman, female    DOB: 11/24/1970, 45 y.o.   MRN: [id number]  HPI  Sandra Huffman is a 45 year old female who presents today with a chief complaint of nasal congestion. She also reports cough, rhinorrhea, sneezing, headaches, and chest congestion. Her symptoms have been present for the past 5 days. Denies fevers, productive cough, sick contacts. She's taken Theraflu without improvement.   Review of Systems  Constitutional: Positive for fatigue. Negative for fever and chills.  HENT: Positive for congestion and sore throat. Negative for postnasal drip.   Respiratory: Positive for cough. Negative for shortness of breath.   Cardiovascular: Negative for chest pain.       Past Medical History  Diagnosis Date  . History of chicken pox   . History of iron deficiency anemia   . HTN (hypertension)      Social History   Social History  . Marital Status: Married    Spouse Name: N/A  . Number of Children: N/A  . Years of Education: N/A   Occupational History  . Not on file.   Social History Main Topics  . Smoking status: Never Smoker   . Smokeless tobacco: Never Used  . Alcohol Use: No  . Drug Use: No  . Sexual Activity: Not on file   Other Topics Concern  . Not on file   Social History Narrative   Caffeine: none   Lives with husband and daughter (2010), no pets, 44 yo grown daughter   Occupation: sewer at [employer]   Edu: HS   Activity: no regular exercise    No past surgical history on file.  Family History  Problem Relation Age of Onset  . Cancer Mother 3873    lung, chewing tobacco  . Hyperlipidemia Father   . Coronary artery disease Neg Hx   . Stroke Neg Hx   . Diabetes Neg Hx     Allergies  Allergen Reactions  . Ace Inhibitors     cough    Current Outpatient Prescriptions on File Prior to Visit  Medication Sig Dispense Refill  . amLODipine (NORVASC) 5 MG tablet TAKE 1 TABLET BY MOUTH DAILY 30 tablet 5  . ferrous sulfate 325  (65 FE) MG tablet Take 1 tablet (325 mg total) by mouth 2 (two) times daily. (Patient not taking: Reported on 11/06/2015) 30 tablet 11  . naproxen sodium (ANAPROX) 220 MG tablet Take 220 mg by mouth as needed. Reported on 11/06/2015    . [DISCONTINUED] hydrochlorothiazide (HYDRODIURIL) 12.5 MG tablet Take 1 tablet (12.5 mg total) by mouth daily. 30 tablet 0  . [DISCONTINUED] potassium chloride SA (K-DUR,KLOR-CON) 20 MEQ tablet Take 2 by mouth daily. 10 tablet 0  . [DISCONTINUED] spironolactone (ALDACTONE) 25 MG tablet Take 1 tablet (25 mg total) by mouth daily. 30 tablet 0   No current facility-administered medications on file prior to visit.    BP 146/96 mmHg  Pulse 91  Temp(Src) 98.1 F (36.7 C) (Oral)  Ht 5\' 1"  (1.549 m)  Wt 136 lb (61.689 kg)  BMI 25.71 kg/m2  SpO2 96%  LMP 10/20/2015    Objective:   Physical Exam  Constitutional: She appears well-nourished.  HENT:  Right Ear: Ear canal normal. Tympanic membrane is bulging. Tympanic membrane is not erythematous.  Left Ear: Ear canal normal. Tympanic membrane is bulging. Tympanic membrane is not erythematous.  Nose: Right sinus exhibits no maxillary sinus tenderness and no frontal sinus tenderness. Left sinus exhibits  no maxillary sinus tenderness and no frontal sinus tenderness.  Mouth/Throat: Oropharynx is clear and moist.  Eyes: Conjunctivae are normal.  Neck: Neck supple.  Cardiovascular: Normal rate and regular rhythm.   Pulmonary/Chest: Effort normal and breath sounds normal. She has no wheezes. She has no rales.  Lymphadenopathy:    She has no cervical adenopathy.  Skin: Skin is warm and dry.          Assessment & Plan:  URI secondary to allergies:  Cough, sneezing, rhinorrhea, sore throat 5 days. No improvement with over-the-counter TheraFlu treatment. Exam today without evidence of acute bacterial process as her lungs are clear, she does not appear acutely ill, and vitals are stable. Suspect are all  involvement caused by allergies and will treat with conservative measures. Flonase, Zyrtec, Robitussin. Discussed return precautions, she verbalized understanding.  Morrie Sheldonlark,Naviyah Schaffert Kendal, NP

## 2016-02-22 ENCOUNTER — Ambulatory Visit: Payer: BLUE CROSS/BLUE SHIELD | Admitting: Primary Care

## 2016-02-25 ENCOUNTER — Ambulatory Visit (INDEPENDENT_AMBULATORY_CARE_PROVIDER_SITE_OTHER): Payer: BLUE CROSS/BLUE SHIELD | Admitting: Family Medicine

## 2016-02-25 ENCOUNTER — Encounter: Payer: Self-pay | Admitting: Family Medicine

## 2016-02-25 VITALS — BP 130/78 | HR 98 | Temp 98.1°F | Wt 134.5 lb

## 2016-02-25 DIAGNOSIS — Z23 Encounter for immunization: Secondary | ICD-10-CM | POA: Diagnosis not present

## 2016-02-25 MED ORDER — LEVOCETIRIZINE DIHYDROCHLORIDE 5 MG PO TABS: 5.0000 mg | ORAL_TABLET | Freq: Every evening | ORAL | 6 refills | Status: DC

## 2016-02-25 NOTE — Progress Notes (Signed)
SUBJECTIVE:  Sandra Huffman is a 45 y.o. female who complains of post nasal drip and dry cough for 3 months. She denies a history of anorexia, chest pain, chills, dizziness and fevers and denies a history of asthma. Patient denies smoke cigarettes.   Current Outpatient Prescriptions on File Prior to Visit  Medication Sig Dispense Refill  . amLODipine (NORVASC) 5 MG tablet TAKE 1 TABLET BY MOUTH DAILY 30 tablet 5  . [DISCONTINUED] hydrochlorothiazide (HYDRODIURIL) 12.5 MG tablet Take 1 tablet (12.5 mg total) by mouth daily. 30 tablet 0  . [DISCONTINUED] potassium chloride SA (K-DUR,KLOR-CON) 20 MEQ tablet Take 2 by mouth daily. 10 tablet 0  . [DISCONTINUED] spironolactone (ALDACTONE) 25 MG tablet Take 1 tablet (25 mg total) by mouth daily. 30 tablet 0   No current facility-administered medications on file prior to visit.     Allergies  Allergen Reactions  . Ace Inhibitors     cough    Past Medical History:  Diagnosis Date  . History of chicken pox   . History of iron deficiency anemia   . HTN (hypertension)     No past surgical history on file.  Family History  Problem Relation Age of Onset  . Cancer Mother 10773    lung, chewing tobacco  . Hyperlipidemia Father   . Coronary artery disease Neg Hx   . Stroke Neg Hx   . Diabetes Neg Hx     Social History   Social History  . Marital status: Married    Spouse name: N/A  . Number of children: N/A  . Years of education: N/A   Occupational History  . Not on file.   Social History Main Topics  . Smoking status: Never Smoker  . Smokeless tobacco: Never Used  . Alcohol use No  . Drug use: No  . Sexual activity: Not on file   Other Topics Concern  . 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  OBJECTIVE: BP 130/78   Pulse 98   Temp 98.1 F (36.7 C) (Oral)   Wt 134 lb 8 oz (61 kg)   LMP 02/10/2016   SpO2 98%   BMI 25.41 kg/m   She appears well, vital signs are as noted. Ears normal.  Throat and pharynx normal.  Neck supple. No adenopathy in the neck. Nose is congested. Sinuses non tender. The chest is clear, without wheezes or rales.  ASSESSMENT:  allergic rhinitis  PLAN: eRx sent for Xyzal 5 mg qhs. Symptomatic therapy suggested: push fluids, rest and return office visit prn if symptoms persist or worsen. Lack of antibiotic effectiveness discussed with her. Call or return to clinic prn if these symptoms worsen or fail to improve as anticipated.

## 2016-02-25 NOTE — Progress Notes (Signed)
Pre visit review using our clinic review tool, if applicable. No additional management support is needed unless otherwise documented below in the visit note. 

## 2016-02-29 ENCOUNTER — Encounter: Payer: Self-pay | Admitting: Internal Medicine

## 2016-02-29 ENCOUNTER — Ambulatory Visit (INDEPENDENT_AMBULATORY_CARE_PROVIDER_SITE_OTHER): Payer: BLUE CROSS/BLUE SHIELD | Admitting: Internal Medicine

## 2016-02-29 ENCOUNTER — Ambulatory Visit (INDEPENDENT_AMBULATORY_CARE_PROVIDER_SITE_OTHER)
Admission: RE | Admit: 2016-02-29 | Discharge: 2016-02-29 | Disposition: A | Discharge status: Home / Self Care | Payer: BLUE CROSS/BLUE SHIELD | Source: Ambulatory Visit | Attending: Internal Medicine | Admitting: Internal Medicine

## 2016-02-29 VITALS — BP 130/78 | HR 97 | Temp 99.1°F | Wt 134.5 lb

## 2016-02-29 DIAGNOSIS — R0602 Shortness of breath: Secondary | ICD-10-CM

## 2016-02-29 DIAGNOSIS — R0789 Other chest pain: Secondary | ICD-10-CM

## 2016-02-29 NOTE — Addendum Note (Signed)
Addended by: Alvina ChouWALSH, TERRI J on: 02/29/2016 05:10 PM   Modules accepted: Orders

## 2016-02-29 NOTE — Patient Instructions (Signed)

## 2016-02-29 NOTE — Progress Notes (Signed)
Subjective:    Patient ID: Sandra Huffman, female    DOB: 06/22/1970, 45 y.o.   MRN: [id number]  HPI  Pt presents to the clinic today with c/o chest tightness. She reports she saw Dr. Dayton MartesAron on Monday for a cough. She was diagnosed with allergies and started on Xyzal. She reports her cough has improved but now c/o chest tightness and mild shortness of breath.  She has taken Aleve without any relief. She denies leg swelling, redness or recent plane travel. She reports she used to be anemic in the past and was on an iron supplement at one point, but has not been in many years.   Review of Systems      Past Medical History:  Diagnosis Date  . History of chicken pox   . History of iron deficiency anemia   . HTN (hypertension)     Current Outpatient Prescriptions  Medication Sig Dispense Refill  . amLODipine (NORVASC) 5 MG tablet TAKE 1 TABLET BY MOUTH DAILY 30 tablet 5  . levocetirizine (XYZAL) 5 MG tablet Take 1 tablet (5 mg total) by mouth every evening. 30 tablet 6   No current facility-administered medications for this visit.     Allergies  Allergen Reactions  . Ace Inhibitors     cough    Family History  Problem Relation Age of Onset  . Cancer Mother 5473    lung, chewing tobacco  . Hyperlipidemia Father   . Coronary artery disease Neg Hx   . Stroke Neg Hx   . Diabetes Neg Hx     Social History   Social History  . Marital status: Married    Spouse name: N/A  . Number of children: N/A  . Years of education: N/A   Occupational History  . Not on file.   Social History Main Topics  . Smoking status: Never Smoker  . Smokeless tobacco: Never Used  . Alcohol use No  . Drug use: No  . Sexual activity: Not on file   Other Topics Concern  . 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  HEENT: Denies eye pain, eye redness, ear pain, ringing in the ears, wax buildup, runny nose, nasal congestion, bloody nose, or sore throat. Respiratory: Pt reports shortness of breath. Denies difficulty breathing, cough or sputum production.   Cardiovascular: Pt reports chest tightness. Denies chest pain, palpitations or swelling in the hands or feet.   No other specific complaints in a complete review of systems (except as listed in HPI above).  Objective:   Physical Exam  BP 130/78   Pulse 97   Temp 99.1 F (37.3 C) (Oral)   Wt 134 lb 8 oz (61 kg)   LMP 02/10/2016   SpO2 99%   BMI 25.41 kg/m  Wt Readings from Last 3 Encounters:  02/29/16 134 lb 8 oz (61 kg)  02/25/16 134 lb 8 oz (61 kg)  11/06/15 136 lb (61.7 kg)    General: Appears her stated age, well developed, well nourished in NAD. HEENT: Throat/Mouth: Teeth present, mucosa pink and moist, no exudate, lesions or ulcerations noted.  Cardiovascular: Normal rate and rhythm. S1,S2 noted.  No murmur, rubs or gallops noted.  Pulmonary/Chest:  Normal effort and positive vesicular breath sounds. No respiratory distress. No wheezes, rales or ronchi noted.   BMET    Component Value Date/Time   NA 139 01/08/2015 1240   K 3.3 (L) 01/08/2015 1240   CL 105 01/08/2015 1240   CO2 27 01/08/2015 1240   GLUCOSE 88 01/08/2015 1240   BUN 12 01/08/2015 1240   CREATININE 0.62 01/08/2015 1240   CREATININE 0.74 07/25/2011 1601   CALCIUM 8.7 01/08/2015 1240    Lipid Panel     Component Value Date/Time   CHOL 178 01/08/2015 1240   TRIG 377.0 (H) 01/08/2015 1240   HDL 38.40 (L) 01/08/2015 1240   CHOLHDL 5 01/08/2015 1240   VLDL 75.4 (H) 01/08/2015 1240    CBC    Component Value Date/Time   WBC 7.7 01/08/2015 1240   RBC 4.62 01/08/2015 1240   HGB 9.9 (L) 01/08/2015 1240   HCT 31.5 (L) 01/08/2015 1240   PLT 212.0 01/08/2015 1240   MCV  68.1 Repeated and verified X2. (L) 01/08/2015 1240   MCHC 31.3 01/08/2015 1240   RDW 17.0 (H) 01/08/2015 1240   LYMPHSABS 2.0 01/08/2015 1240   MONOABS 0.7 01/08/2015 1240   EOSABS 0.5 01/08/2015 1240   BASOSABS 0.0 01/08/2015 1240    Hgb A1C No results found for: HGBA1C          Assessment & Plan:   Chest tightness and shortness of breath:  Does not sound cardiac in nature Low concern for PE Offered Albuterol inhaler, she declined Will check CBC with diff, iron panel and chest xray today- will call you with the results  If worse over the weekend, go straight to ER  RTC as needed or if symptoms persist or worsen Jaretzi Droz, NP

## 2016-03-01 ENCOUNTER — Other Ambulatory Visit: Payer: Self-pay | Admitting: Family Medicine

## 2016-03-01 LAB — CBC WITH DIFFERENTIAL/PLATELET
BASOS ABS: 0 {cells}/uL (ref 0–200)
Basophils Relative: 0 %
EOS ABS: 498 {cells}/uL (ref 15–500)
Eosinophils Relative: 6 %
HCT: 26.9 % — ABNORMAL LOW (ref 35.0–45.0)
Hemoglobin: 8 g/dL — ABNORMAL LOW (ref 11.7–15.5)
Lymphocytes Relative: 26 %
Lymphs Abs: 2158 cells/uL (ref 850–3900)
MCH: 18 pg — AB (ref 27.0–33.0)
MCHC: 29.7 g/dL — AB (ref 32.0–36.0)
MCV: 60.6 fL — AB (ref 80.0–100.0)
MONOS PCT: 8 %
MPV: 9.6 fL (ref 7.5–12.5)
Monocytes Absolute: 664 cells/uL (ref 200–950)
NEUTROS ABS: 4980 {cells}/uL (ref 1500–7800)
NEUTROS PCT: 60 %
Platelets: 320 10*3/uL (ref 140–400)
RBC: 4.44 MIL/uL (ref 3.80–5.10)
RDW: 17.2 % — ABNORMAL HIGH (ref 11.0–15.0)
WBC: 8.3 10*3/uL (ref 3.8–10.8)

## 2016-03-05 LAB — IRON: Iron: 11 ug/dL — ABNORMAL LOW (ref 40–190)

## 2016-03-05 LAB — IBC PANEL
%SAT: 3 % — ABNORMAL LOW (ref 11–50)
TIBC: 419 ug/dL (ref 250–450)
UIBC: 408 ug/dL — ABNORMAL HIGH (ref 125–400)

## 2016-03-10 ENCOUNTER — Encounter: Payer: Self-pay | Admitting: Family Medicine

## 2016-03-10 ENCOUNTER — Ambulatory Visit (INDEPENDENT_AMBULATORY_CARE_PROVIDER_SITE_OTHER): Payer: BLUE CROSS/BLUE SHIELD | Admitting: Family Medicine

## 2016-03-10 VITALS — BP 136/80 | HR 85 | Temp 98.3°F | Ht 61.0 in | Wt 132.8 lb

## 2016-03-10 DIAGNOSIS — T7840XD Allergy, unspecified, subsequent encounter: Secondary | ICD-10-CM

## 2016-03-10 DIAGNOSIS — Z862 Personal history of diseases of the blood and blood-forming organs and certain disorders involving the immune mechanism: Secondary | ICD-10-CM | POA: Diagnosis not present

## 2016-03-10 DIAGNOSIS — J040 Acute laryngitis: Secondary | ICD-10-CM

## 2016-03-10 DIAGNOSIS — I10 Essential (primary) hypertension: Secondary | ICD-10-CM

## 2016-03-10 DIAGNOSIS — Z0000 Encounter for general adult medical examination without abnormal findings: Secondary | ICD-10-CM

## 2016-03-10 DIAGNOSIS — T7840XA Allergy, unspecified, initial encounter: Secondary | ICD-10-CM | POA: Insufficient documentation

## 2016-03-10 MED ORDER — FERROUS SULFATE 325 (65 FE) MG PO TBEC: 325.0000 mg | DELAYED_RELEASE_TABLET | Freq: Three times a day (TID) | ORAL | 3 refills | Status: DC

## 2016-03-10 NOTE — Assessment & Plan Note (Signed)
Deteriorated. See below. eRx sent for ferrous sulfate three times daily, follow up in 1 month.

## 2016-03-10 NOTE — Patient Instructions (Signed)
Great to see you. Please start taking ferrous sulfate three times daily. Come see me in 1 month.

## 2016-03-10 NOTE — Assessment & Plan Note (Signed)
Resolved.  Explained to pt that unlikely an allergic reaction and that she has tolerated iron in past. Will send in erx for ferrous sulfate that she did tolerate well in past and advised she have benadryl at home in case she does develop any allergic symptoms. The patient indicates understanding of these issues and agrees with the plan.

## 2016-03-10 NOTE — Progress Notes (Signed)
Pre visit review using our clinic review tool, if applicable. No additional management support is needed unless otherwise documented below in the visit note. 

## 2016-03-10 NOTE — Progress Notes (Signed)
Subjective:    Patient ID: Sandra Huffman, female    DOB: 05/18/1970, 45 y.o.   MRN: [id number]  HPI  Very pleasant 45 year old here for follow up anemia.  [facility]  Note reviewed.  She checked CBC which showed worsening anemia and  advised her to start taking Ferrous sulfate three times daily.  She bought OTC iron, she is unsure of the dose.  Ended up going to UC on 10/28 for laryngitis. She was worried that shew as having an allergic reaction to OTC iron. Notes reviewed.  Diagnosed with viral pharyngitis and given rx for methylprednisolone. Symptoms resolved.  Lab Results  Component Value Date   WBC 8.3 02/29/2016   HGB 8.0 (L) 02/29/2016   HCT 26.9 (L) 02/29/2016   MCV 60.6 (L) 02/29/2016   PLT 320 02/29/2016      Patient Active Problem List   Diagnosis Date Noted  . Laryngitis 03/10/2016  . Encounter for routine gynecological examination 01/08/2015  . Routine general medical examination at a health care facility 06/02/2013  . Hypokalemia 08/14/2011  . HTN (hypertension) 07/09/2011  . History of iron deficiency anemia    Past Medical History:  Diagnosis Date  . History of chicken pox   . History of iron deficiency anemia   . HTN (hypertension)    No past surgical history on file. Social History  Substance Use Topics  . Smoking status: Never Smoker  . Smokeless tobacco: Never Used  . Alcohol use No   Family History  Problem Relation Age of Onset  . Cancer Mother 7873    lung, chewing tobacco  . Hyperlipidemia Father   . Coronary artery disease Neg Hx   . Stroke Neg Hx   . Diabetes Neg Hx    Allergies  Allergen Reactions  . Ace Inhibitors     cough   Current Outpatient Prescriptions on File Prior to Visit  Medication Sig Dispense Refill  . amLODipine (NORVASC) 5 MG tablet Take 1 tablet (5 mg total) by mouth daily. COMPLETE PHYSICAL EXAM REQUIRED FOR ADDITIONAL REFILLS 30 tablet 0  . levocetirizine (XYZAL) 5 MG tablet  Take 1 tablet (5 mg total) by mouth every evening. 30 tablet 6  . [DISCONTINUED] hydrochlorothiazide (HYDRODIURIL) 12.5 MG tablet Take 1 tablet (12.5 mg total) by mouth daily. 30 tablet 0  . [DISCONTINUED] potassium chloride SA (K-DUR,KLOR-CON) 20 MEQ tablet Take 2 by mouth daily. 10 tablet 0  . [DISCONTINUED] spironolactone (ALDACTONE) 25 MG tablet Take 1 tablet (25 mg total) by mouth daily. 30 tablet 0   No current facility-administered medications on file prior to visit.    The PMH, PSH, Social History, Family History, Medications, and allergies have been reviewed [facility], and have been updated if relevant.    Review of Systems  Constitutional: Negative.   HENT: Negative.   Eyes: Negative.   Respiratory: Negative.   Cardiovascular: Negative.   Gastrointestinal: Negative.   Endocrine: Negative.   Genitourinary: Negative.   Musculoskeletal: Negative.   Skin: Negative.   Allergic/Immunologic: Negative.   Neurological: Negative.   Hematological: Negative.   Psychiatric/Behavioral: Negative.   All other systems reviewed and are negative.      Objective:   Physical Exam BP 136/80 (BP Location: Left Arm, Patient Position: Sitting, Cuff Size: Normal)   Pulse 85   Temp 98.3 F (36.8 C) (Oral)   Ht 5\' 1"  (1.549 m)   Wt 132 lb 12 oz (60.2 kg)  LMP 02/10/2016   SpO2 98%   BMI 25.08 kg/m   General:  Well-developed,well-nourished,in no acute distress; alert,appropriate and cooperative throughout examination Head:  normocephalic and atraumatic.   Eyes:  vision grossly intact, pupils equal, pupils round, and pupils reactive to light.   Nose:  no external deformity.   Mouth:  good dentition.   Lungs:  Normal respiratory effort, chest expands symmetrically. Lungs are clear to auscultation, no crackles or wheezes. Heart:  Normal rate and regular rhythm. S1 and S2 normal without gallop, murmur, click, rub or other extra sounds. Abdomen:  Bowel sounds positive,abdomen soft and  non-tender without masses, organomegaly or hernias noted. Msk:  No deformity or scoliosis noted of thoracic or lumbar spine.   Extremities:  No clubbing, cyanosis, edema, or deformity noted with normal full range of motion of all joints.   Neurologic:  alert & oriented X3 and gait normal.   Skin:  Intact without suspicious lesions or rashes Psych:  Cognition and judgment appear intact. Alert and cooperative with normal attention span and concentration. No apparent delusions, illusions, hallucinations        Assessment & Plan:

## 2016-05-06 ENCOUNTER — Other Ambulatory Visit: Payer: Self-pay | Admitting: Family Medicine

## 2016-10-15 ENCOUNTER — Telehealth: Payer: Self-pay | Admitting: Family Medicine

## 2016-10-15 NOTE — Telephone Encounter (Signed)
Patient Name: Sandra Huffman  DOB: 10/15/1970    Initial Comment sharp pain in chest, on right side    Nurse Assessment  Nurse: Scarlette ArStandifer, RN, Heather Date/Time (Eastern Time): 10/15/2016 2:57:10 PM  Confirm and document reason for call. If symptomatic, describe symptoms. ---Caller states that she had some chest pain last night, it was coming and going, she has not had any pain since.  Does the patient have any new or worsening symptoms? ---Yes  Will a triage be completed? ---Yes  Related visit to physician within the last 2 weeks? ---No  Does the PT have any chronic conditions? (i.e. diabetes, asthma, etc.) ---Yes  List chronic conditions. ---HTN  Is the patient pregnant or possibly pregnant? (Ask all females between the ages of 8212-55) ---No  Is this a behavioral health or substance abuse call? ---No     Guidelines    Guideline Title Affirmed Question Affirmed Notes  Chest Pain [1] Chest pain lasting <= 5 minutes AND [2] NO chest pain or cardiac symptoms now (Exceptions: pains lasting a few seconds)    Final Disposition User   See Physician within 24 Hours Standifer, RN, Insurance underwriterHeather    Comments  Caller states that she will think about going to the ED.   Referrals  GO TO FACILITY UNDECIDED   Disagree/Comply: Comply

## 2016-10-15 NOTE — Telephone Encounter (Signed)
I spoke with pt and she is going to UC FYI to Dr Dayton MartesAron.

## 2016-10-20 ENCOUNTER — Ambulatory Visit (INDEPENDENT_AMBULATORY_CARE_PROVIDER_SITE_OTHER): Payer: BLUE CROSS/BLUE SHIELD | Admitting: Family Medicine

## 2016-10-20 ENCOUNTER — Encounter: Payer: Self-pay | Admitting: Family Medicine

## 2016-10-20 VITALS — BP 140/90 | Ht 61.0 in | Wt 131.0 lb

## 2016-10-20 DIAGNOSIS — Z862 Personal history of diseases of the blood and blood-forming organs and certain disorders involving the immune mechanism: Secondary | ICD-10-CM | POA: Diagnosis not present

## 2016-10-20 DIAGNOSIS — R079 Chest pain, unspecified: Secondary | ICD-10-CM | POA: Diagnosis not present

## 2016-10-20 LAB — CBC WITH DIFFERENTIAL/PLATELET
BASOS PCT: 0.1 % (ref 0.0–3.0)
Basophils Absolute: 0 10*3/uL (ref 0.0–0.1)
Eosinophils Absolute: 0 10*3/uL (ref 0.0–0.7)
Eosinophils Relative: 0 % (ref 0.0–5.0)
HCT: 34.6 % — ABNORMAL LOW (ref 36.0–46.0)
Hemoglobin: 10.9 g/dL — ABNORMAL LOW (ref 12.0–15.0)
Lymphocytes Relative: 17.9 % (ref 12.0–46.0)
Lymphs Abs: 2.7 10*3/uL (ref 0.7–4.0)
MCHC: 31.5 g/dL (ref 30.0–36.0)
MCV: 71.6 fl — ABNORMAL LOW (ref 78.0–100.0)
Monocytes Absolute: 1.4 10*3/uL — ABNORMAL HIGH (ref 0.1–1.0)
Monocytes Relative: 9.4 % (ref 3.0–12.0)
Neutro Abs: 10.8 10*3/uL — ABNORMAL HIGH (ref 1.4–7.7)
Neutrophils Relative %: 72.6 % (ref 43.0–77.0)
Platelets: 261 10*3/uL (ref 150.0–400.0)
RBC: 4.83 Mil/uL (ref 3.87–5.11)
RDW: 16 % — ABNORMAL HIGH (ref 11.5–15.5)
WBC: 14.9 10*3/uL — AB (ref 4.0–10.5)

## 2016-10-20 LAB — FERRITIN: FERRITIN: 4.3 ng/mL — AB (ref 10.0–291.0)

## 2016-10-20 NOTE — Assessment & Plan Note (Signed)
-  Check CBC and ferritin today.  

## 2016-10-20 NOTE — Progress Notes (Signed)
Subjective:   Patient ID: Sandra Huffman, female    DOB: 11-Sep-1970, 46 y.o.   MRN: [id number]  Sandra Huffman is a pleasant 46 y.o. year old female who presents to clinic today with Follow-up (went last week to urgent care for CP and SOB)  on 10/20/2016  HPI:  Also went to [facility] care last week for CP and SOB- [facility], [employer]. Notes reviewed on care everywhere.  EKG unremarkable.  CXR showed new right focal opacity in right lung base- "probable pneumonitis."  Placed on prednisone and doxycyline and advised to follow up with me.  She already feels much better.  Chest pain has almost completely resolved.  Cough is no longer productive.  Anemia- was taking iron twice daily but often forgets and misses doses.  Lab Results  Component Value Date   WBC 8.3 02/29/2016   HGB 8.0 (L) 02/29/2016   HCT 26.9 (L) 02/29/2016   MCV 60.6 (L) 02/29/2016   PLT 320 02/29/2016   Current Outpatient Prescriptions on File Prior to Visit  Medication Sig Dispense Refill  . amLODipine (NORVASC) 5 MG tablet TAKE 1 TABLET BY MOUTH EVERY DAY 30 tablet 10  . ferrous sulfate 325 (65 FE) MG EC tablet Take 1 tablet (325 mg total) by mouth 3 (three) times daily with meals. 90 tablet 3  . levocetirizine (XYZAL) 5 MG tablet Take 1 tablet (5 mg total) by mouth every evening. 30 tablet 6  . [DISCONTINUED] hydrochlorothiazide (HYDRODIURIL) 12.5 MG tablet Take 1 tablet (12.5 mg total) by mouth daily. 30 tablet 0  . [DISCONTINUED] potassium chloride SA (K-DUR,KLOR-CON) 20 MEQ tablet Take 2 by mouth daily. 10 tablet 0  . [DISCONTINUED] spironolactone (ALDACTONE) 25 MG tablet Take 1 tablet (25 mg total) by mouth daily. 30 tablet 0   No current facility-administered medications on file prior to visit.     Allergies  Allergen Reactions  . Ace Inhibitors     cough    Past Medical History:  Diagnosis Date  . History of chicken pox   . History of iron deficiency anemia   . HTN (hypertension)     No past  surgical history on file.  Family History  Problem Relation Age of Onset  . Cancer Mother 67       lung, chewing tobacco  . Hyperlipidemia Father   . Coronary artery disease Neg Hx   . Stroke Neg Hx   . Diabetes Neg Hx     Social History   Social History  . Marital status: Married    Spouse name: N/A  . Number of children: N/A  . Years of education: N/A   Occupational History  . Not on file.   Social History Main Topics  . Smoking status: Never Smoker  . Smokeless tobacco: Never Used  . Alcohol use No  . Drug use: No  . Sexual activity: Not on file   Other Topics Concern  . 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   Review of Systems  Constitutional: Negative.   Respiratory: Negative.   Cardiovascular: Negative.   Gastrointestinal: Negative.   Musculoskeletal: Negative.   Neurological: Negative.  Hematological: Negative.   Psychiatric/Behavioral: Negative.   All other systems reviewed and are negative.      Objective:    BP 140/90   Ht 5\' 1"  (1.549 m)   Wt 131 lb (59.4 kg)   SpO2 99%   BMI 24.75 kg/m    Physical Exam  Constitutional: She is oriented to person, place, and time. She appears well-developed and well-nourished. No distress.  HENT:  Head: Normocephalic and atraumatic.  Eyes: Conjunctivae are normal.  Cardiovascular: Normal rate and regular rhythm.   Pulmonary/Chest: Effort normal and breath sounds normal. No respiratory distress. She has no wheezes. She has no rales.  Musculoskeletal: Normal range of motion.  Neurological: She is alert and oriented to person, place, and time. No cranial nerve deficit.  Skin: Skin is warm and dry.  She is not diaphoretic.  Psychiatric: She has a normal mood and affect. Her behavior is normal. Judgment and thought content normal.  Nursing note and vitals reviewed.         Assessment & Plan:   Chest pain, unspecified type - Plan: CBC with Differential/Platelet  History of iron deficiency anemia - Plan: CBC with Differential/Platelet, Ferritin No Follow-up on file.

## 2016-10-20 NOTE — Patient Instructions (Signed)
Great to see you.  I will call you with your lab results.  Please take all of your medication.

## 2016-10-20 NOTE — Progress Notes (Signed)
Pre visit review using our clinic review tool, if applicable. No additional management support is needed unless otherwise documented below in the visit note. 

## 2016-10-20 NOTE — Assessment & Plan Note (Signed)
CXR consistent with pneumonitis. Exam is reassuring and she is feeling better. Finish rxs, follow up with me in 2 weeks. The patient indicates understanding of these issues and agrees with the plan.

## 2016-11-03 ENCOUNTER — Ambulatory Visit: Payer: BLUE CROSS/BLUE SHIELD | Admitting: Family Medicine

## 2016-11-03 ENCOUNTER — Other Ambulatory Visit: Payer: Self-pay | Admitting: Family Medicine

## 2016-11-03 ENCOUNTER — Ambulatory Visit (INDEPENDENT_AMBULATORY_CARE_PROVIDER_SITE_OTHER)
Admission: RE | Admit: 2016-11-03 | Discharge: 2016-11-03 | Disposition: A | Discharge status: Home / Self Care | Payer: BLUE CROSS/BLUE SHIELD | Source: Ambulatory Visit | Attending: Family Medicine | Admitting: Family Medicine

## 2016-11-03 DIAGNOSIS — J189 Pneumonia, unspecified organism: Secondary | ICD-10-CM | POA: Insufficient documentation

## 2016-11-13 ENCOUNTER — Other Ambulatory Visit: Payer: Self-pay | Admitting: Family Medicine

## 2016-11-13 DIAGNOSIS — Z1231 Encounter for screening mammogram for malignant neoplasm of breast: Secondary | ICD-10-CM

## 2016-11-19 ENCOUNTER — Ambulatory Visit
Admission: RE | Admit: 2016-11-19 | Discharge: 2016-11-19 | Disposition: A | Discharge status: Home / Self Care | Payer: BLUE CROSS/BLUE SHIELD | Source: Ambulatory Visit | Attending: Family Medicine | Admitting: Family Medicine

## 2016-11-19 DIAGNOSIS — Z1231 Encounter for screening mammogram for malignant neoplasm of breast: Secondary | ICD-10-CM

## 2017-05-11 ENCOUNTER — Other Ambulatory Visit: Payer: Self-pay | Admitting: Family Medicine

## 2017-08-02 ENCOUNTER — Other Ambulatory Visit: Payer: Self-pay | Admitting: Family Medicine

## 2017-09-03 ENCOUNTER — Other Ambulatory Visit: Payer: Self-pay | Admitting: Family Medicine

## 2017-09-04 NOTE — Telephone Encounter (Signed)
Was advised with last fill that needs appt for future fills/thx dmf

## 2017-09-09 ENCOUNTER — Ambulatory Visit: Payer: PRIVATE HEALTH INSURANCE | Admitting: Family Medicine

## 2017-09-09 ENCOUNTER — Other Ambulatory Visit: Payer: Self-pay

## 2017-09-09 VITALS — BP 130/86 | HR 87 | Temp 98.2°F | Ht 61.0 in | Wt 133.8 lb

## 2017-09-09 DIAGNOSIS — N3001 Acute cystitis with hematuria: Secondary | ICD-10-CM | POA: Diagnosis not present

## 2017-09-09 DIAGNOSIS — I10 Essential (primary) hypertension: Secondary | ICD-10-CM

## 2017-09-09 DIAGNOSIS — N390 Urinary tract infection, site not specified: Secondary | ICD-10-CM | POA: Insufficient documentation

## 2017-09-09 LAB — POCT URINALYSIS DIPSTICK
Bilirubin, UA: NEGATIVE
Glucose, UA: NEGATIVE
Ketones, UA: NEGATIVE
Leukocytes, UA: NEGATIVE
NITRITE UA: NEGATIVE
Protein, UA: 15
Spec Grav, UA: 1.01 (ref 1.010–1.025)
Urobilinogen, UA: 0.2 E.U./dL
pH, UA: 6 (ref 5.0–8.0)

## 2017-09-09 MED ORDER — CIPROFLOXACIN HCL 500 MG PO TABS: 500.0000 mg | ORAL_TABLET | Freq: Two times a day (BID) | ORAL | 0 refills | Status: DC

## 2017-09-09 MED ORDER — AMLODIPINE BESYLATE 5 MG PO TABS
ORAL_TABLET | ORAL | 3 refills | Status: DC
Start: 2017-09-09 — End: 2018-10-22

## 2017-09-09 MED ORDER — FERROUS SULFATE 325 (65 FE) MG PO TBEC: 325.0000 mg | DELAYED_RELEASE_TABLET | Freq: Three times a day (TID) | ORAL | 3 refills | Status: DC

## 2017-09-09 NOTE — Progress Notes (Signed)
Subjective:   Patient ID: Sandra Huffman, female    DOB: July 10, 1970, 47 y.o.   MRN: [id number]  Sandra Huffman is a pleasant 47 y.o. year old female who presents to clinic today with Urinary Tract Infection (possible UTI started on Saturday, burning. OTC meds not helping.) and Medication Refill (amlodipine,ferrous sulfate)  on 09/09/2017  HPI:  ?UTI- Sandra Huffman complains of urinary frequency, urgency and dysuria x 3 days, without flank pain, fever, chills, or abnormal vaginal discharge or bleeding.    HTN- BP has been well controlled on low dose norvasc.  She is asking for refills of this today. Denies any HA, blurred vision, CP or SOB. No LE edema. Lab Results  Component Value Date   CREATININE 0.62 01/08/2015     Current Outpatient Medications on File Prior to Visit  Medication Sig Dispense Refill  . amLODipine (NORVASC) 5 MG tablet TAKE 1 TABLET BY MOUTH EVERY DAY. PLEASE SCHEDULE APPT 30 tablet 0  . ferrous sulfate 325 (65 FE) MG EC tablet Take 1 tablet (325 mg total) by mouth 3 (three) times daily with meals. 90 tablet 3  . levocetirizine (XYZAL) 5 MG tablet Take 1 tablet (5 mg total) by mouth every evening. 30 tablet 6  . [DISCONTINUED] hydrochlorothiazide (HYDRODIURIL) 12.5 MG tablet Take 1 tablet (12.5 mg total) by mouth daily. 30 tablet 0  . [DISCONTINUED] potassium chloride SA (K-DUR,KLOR-CON) 20 MEQ tablet Take 2 by mouth daily. 10 tablet 0  . [DISCONTINUED] spironolactone (ALDACTONE) 25 MG tablet Take 1 tablet (25 mg total) by mouth daily. 30 tablet 0   No current facility-administered medications on file prior to visit.     Allergies  Allergen Reactions  . Ace Inhibitors     cough    Past Medical History:  Diagnosis Date  . History of chicken pox   . History of iron deficiency anemia   . HTN (hypertension)     No past surgical history on file.  Family History  Problem Relation Age of Onset  . Cancer Mother 36       lung, chewing tobacco  . Hyperlipidemia Father   . Coronary  artery disease Neg Hx   . Stroke Neg Hx   . Diabetes Neg Hx     Social History   Socioeconomic History  . Marital status: Married    Spouse name: Not on file  . Number of children: Not on file  . Years of education: Not on file  . Highest education level: Not on file  Occupational History  . Not on file  Social Needs  . Financial resource strain: Not on file  . Food insecurity:    Worry: Not on file    Inability: Not on file  . Transportation needs:    Medical: Not on file    Non-medical: Not on file  Tobacco Use  . Smoking status: Never Smoker  . Smokeless tobacco: Never Used  Substance and Sexual Activity  . Alcohol use: No  . Drug use: No  . Sexual activity: Not on file  Lifestyle  . Physical activity:    Days per week: Not on file    Minutes per session: Not on file  . Stress: Not on file  Relationships  . Social connections:    Talks on phone: Not on file    Gets together: Not on file    Attends religious service: Not on file    Active member of club or organization: Not on file  Attends meetings of clubs or organizations: Not on file    Relationship status: Not on file  . Intimate partner violence:    Fear of current or ex partner: Not on file    Emotionally abused: Not on file    Physically abused: Not on file    Forced sexual activity: Not on file  Other Topics Concern  . Not on file  Social History Narrative   Caffeine: none   Lives with husband and daughter (2010), no pets, 47 yo grown daughter   Occupation: sewer at [employer]   Edu: HS   Activity: no regular exercise   The PMH, PSH, Social History, Family History, Medications, and allergies have been reviewed in [facility], and have been updated if relevant.   Review of Systems  Constitutional: Negative.   Respiratory: Negative.   Cardiovascular: Negative.   Gastrointestinal: Negative.   Genitourinary: Positive for dysuria, frequency and urgency. Negative for decreased urine volume,  difficulty urinating, dyspareunia, enuresis, flank pain, genital sores, hematuria, menstrual problem, pelvic pain, vaginal bleeding, vaginal discharge and vaginal pain.  Musculoskeletal: Negative.   Neurological: Negative.   Psychiatric/Behavioral: Negative.   All other systems reviewed and are negative.      Objective:    BP 130/86 (BP Location: Left Arm, Patient Position: Sitting, Cuff Size: Normal)   Pulse 87   Temp 98.2 F (36.8 C) (Oral)   Ht  (1.549 m)   Wt 133 lb 12.8 oz (60.7 kg)   SpO2 98%   BMI 25.28 kg/m    Physical Exam  Constitutional: She is oriented to person, place, and time. She appears well-developed and well-nourished. No distress.  HENT:  Head: Normocephalic and atraumatic.  Eyes: EOM are normal.  Neck: Normal range of motion.  Cardiovascular: Normal rate and regular rhythm.  Pulmonary/Chest: Effort normal and breath sounds normal.  Abdominal: Soft. There is no tenderness.  Musculoskeletal:  No CVA tenderness  Neurological: She is alert and oriented to person, place, and time. No cranial nerve deficit.  Skin: Skin is warm and dry. She is not diaphoretic.  Psychiatric: She has a normal mood and affect. Her behavior is normal. Judgment and thought content normal.  Nursing note and vitals reviewed.         Assessment & Plan:   Essential hypertension  Acute cystitis with hematuria No follow-ups on file.

## 2017-09-09 NOTE — Assessment & Plan Note (Signed)
New- UA pos for RBCs only but has all classic symptoms of cystitis. Will treat with 3 day course of cipro 500 mg twice daily. Send urine for cx. Also push fluids, may use Pyridium OTC prn. Call or return to clinic prn if these symptoms worsen or fail to improve as anticipated.

## 2017-09-09 NOTE — Assessment & Plan Note (Signed)
Well controlled on current dose of amlodipine. No changes made. eRx refill sent. 

## 2017-09-09 NOTE — Patient Instructions (Signed)

## 2017-09-12 LAB — URINE CULTURE
MICRO NUMBER:: 90531446
SPECIMEN QUALITY:: ADEQUATE

## 2018-01-25 ENCOUNTER — Ambulatory Visit (INDEPENDENT_AMBULATORY_CARE_PROVIDER_SITE_OTHER): Payer: Commercial Managed Care - PPO | Admitting: Family Medicine

## 2018-01-25 ENCOUNTER — Encounter: Payer: Self-pay | Admitting: Family Medicine

## 2018-01-25 ENCOUNTER — Ambulatory Visit (INDEPENDENT_AMBULATORY_CARE_PROVIDER_SITE_OTHER): Payer: Commercial Managed Care - PPO

## 2018-01-25 VITALS — BP 124/84 | HR 87 | Temp 98.3°F | Ht 61.0 in | Wt 130.0 lb

## 2018-01-25 DIAGNOSIS — M549 Dorsalgia, unspecified: Secondary | ICD-10-CM

## 2018-01-25 DIAGNOSIS — Z23 Encounter for immunization: Secondary | ICD-10-CM

## 2018-01-25 DIAGNOSIS — M546 Pain in thoracic spine: Secondary | ICD-10-CM | POA: Insufficient documentation

## 2018-01-25 LAB — COMPREHENSIVE METABOLIC PANEL
ALBUMIN: 3.8 g/dL (ref 3.5–5.2)
ALK PHOS: 78 U/L (ref 39–117)
ALT: 21 U/L (ref 0–35)
AST: 25 U/L (ref 0–37)
BUN: 10 mg/dL (ref 6–23)
CO2: 30 mEq/L (ref 19–32)
Calcium: 8.9 mg/dL (ref 8.4–10.5)
Chloride: 101 mEq/L (ref 96–112)
Creatinine, Ser: 0.49 mg/dL (ref 0.40–1.20)
GFR: 144.12 mL/min (ref >60.00)
Glucose, Bld: 92 mg/dL (ref 70–99)
Potassium: 3.3 mEq/L — ABNORMAL LOW (ref 3.5–5.1)
SODIUM: 136 meq/L (ref 135–145)
TOTAL PROTEIN: 7.8 g/dL (ref 6.0–8.3)
Total Bilirubin: 0.5 mg/dL (ref 0.2–1.2)

## 2018-01-25 LAB — D-DIMER, QUANTITATIVE: D-Dimer, Quant: 0.34 mcg/mL FEU (ref <0.50)

## 2018-01-25 NOTE — Patient Instructions (Addendum)
Costochondritis Costochondritis is swelling and irritation (inflammation) of the tissue (cartilage) that connects your ribs to your breastbone (sternum). This causes pain in the front of your chest. Usually, the pain:  Starts gradually.  Is in more than one rib.  This condition usually goes away on its own over time. Follow these instructions at home:  Do not do anything that makes your pain worse.  If directed, put ice on the painful area: ? Put ice in a plastic bag. ? Place a towel between your skin and the bag. ? Leave the ice on for 20 minutes, 2-3 times a day.  If directed, put heat on the affected area as often as told by your doctor. Use the heat source that your doctor tells you to use, such as a moist heat pack or a heating pad. ? Place a towel between your skin and the heat source. ? Leave the heat on for 20-30 minutes. ? Take off the heat if your skin turns bright red. This is very important if you cannot feel pain, heat, or cold. You may have a greater risk of getting burned.  Take over-the-counter and prescription medicines only as told by your doctor.  Return to your normal activities as told by your doctor. Ask your doctor what activities are safe for you.  Keep all follow-up visits as told by your doctor. This is important. Contact a doctor if:  You have chills or a fever.  Your pain does not go away or it gets worse.  You have a cough that does not go away. Get help right away if:  You are short of breath. This information is not intended to replace advice given to you by your health care provider. Make sure you discuss any questions you have with your health care provider. Document Released: 10/15/2007 Document Revised: 11/16/2015 Document Reviewed: 08/22/2015 Elsevier Interactive Patient Education  2018 [employer]  Please schedule a Physical with P[facility] :)

## 2018-01-25 NOTE — Progress Notes (Signed)
Subjective:   Patient ID: Sandra Huffman, female    DOB: 13-Feb-1971, 47 y.o.   MRN: [id number]  Sandra Huffman is a pleasant 47 y.o. year old female who presents to clinic today with Back Pain (Patient is here today C/O right upper back pain that is on her bra line.  Says it hurts to breathe.  Sx started on Saturday.  Denies any coughing.  Denies any injury.  She is currently taking Aleve which helps.  She states that when she takes the Fe tablets she ends up not being able to breath and throat was swelling.)  on 01/25/2018  HPI:  2 days ago, acute onset of right upper back pain around her bra line.  Hurts to take deep breaths.  Denies coughing or URI symptoms.  No known injury.  Deep breaths does seem to make it worse.  No recent travel.  Alleve helps.  Has taken two doses.  Pain is sharp but fleeting. No shoulder or arm pain. No CP.  Not worsened by walking or exertion, just deep slow inspirations.  Current Outpatient Medications on File Prior to Visit  Medication Sig Dispense Refill  . amLODipine (NORVASC) 5 MG tablet TAKE 1 TABLET BY MOUTH EVERY DAY. PLEASE SCHEDULE APPT 90 tablet 3  . ferrous sulfate 325 (65 FE) MG EC tablet Take 1 tablet (325 mg total) by mouth 3 (three) times daily with meals. (Patient not taking: Reported on 01/25/2018) 90 tablet 3  . [DISCONTINUED] hydrochlorothiazide (HYDRODIURIL) 12.5 MG tablet Take 1 tablet (12.5 mg total) by mouth daily. 30 tablet 0  . [DISCONTINUED] spironolactone (ALDACTONE) 25 MG tablet Take 1 tablet (25 mg total) by mouth daily. 30 tablet 0   No current facility-administered medications on file prior to visit.     Allergies  Allergen Reactions  . Iron Anaphylaxis  . Ace Inhibitors     cough    Past Medical History:  Diagnosis Date  . History of chicken pox   . History of iron deficiency anemia   . HTN (hypertension)     No past surgical history on file.  Family History  Problem Relation Age of Onset  . Cancer Mother 81       lung,  chewing tobacco  . Hyperlipidemia Father   . Coronary artery disease Neg Hx   . Stroke Neg Hx   . Diabetes Neg Hx     Social History   Socioeconomic History  . Marital status: Married    Spouse name: Not on file  . Number of children: Not on file  . Years of education: Not on file  . Highest education level: Not on file  Occupational History  . Not on file  Social Needs  . Financial resource strain: Not on file  . Food insecurity:    Worry: Not on file    Inability: Not on file  . Transportation needs:    Medical: Not on file    Non-medical: Not on file  Tobacco Use  . Smoking status: Never Smoker  . Smokeless tobacco: Never Used  Substance and Sexual Activity  . Alcohol use: No  . Drug use: No  . Sexual activity: Not on file  Lifestyle  . Physical activity:    Days per week: Not on file    Minutes per session: Not on file  . Stress: Not on file  Relationships  . Social connections:    Talks on phone: Not on file    Gets together: Not on  file    Attends religious service: Not on file    Active member of club or organization: Not on file    Attends meetings of clubs or organizations: Not on file    Relationship status: Not on file  . Intimate partner violence:    Fear of current or ex partner: Not on file    Emotionally abused: Not on file    Physically abused: Not on file    Forced sexual activity: Not on file  Other Topics Concern  . Not on file  Social History Narrative   Caffeine: none   Lives with husband and daughter (2010), no pets, 46 yo grown daughter   Occupation: sewer at [employer]   Edu: HS   Activity: no regular exercise   The PMH, PSH, Social History, Family History, Medications, and allergies [facility], and have been updated if relevant.   Review of Systems  Constitutional: Negative.   HENT: Negative.   Eyes: Negative.   Respiratory: Negative.   Cardiovascular: Negative.   Gastrointestinal: Negative.   Endocrine:  Negative.   Genitourinary: Negative.   Musculoskeletal: Positive for back pain.  Skin: Negative.   Allergic/Immunologic: Negative.   Neurological: Negative.   Psychiatric/Behavioral: Negative.   All other systems reviewed and are negative.      Objective:    BP 124/84 (BP Location: Left Arm, Patient Position: Sitting, Cuff Size: Normal)   Pulse 87   Temp 98.3 F (36.8 C) (Oral)   Ht 5\' 1"  (1.549 m)   Wt 130 lb (59 kg)   LMP 01/03/2018   SpO2 98%   BMI 24.56 kg/m    Physical Exam  Constitutional: She is oriented to person, place, and time. She appears well-developed and well-nourished. No distress.  HENT:  Head: Normocephalic and atraumatic.  Eyes: EOM are normal.  Cardiovascular: Normal rate and regular rhythm.  Pulmonary/Chest: Effort normal and breath sounds normal. No stridor. No respiratory distress. She has no wheezes. She has no rales. She exhibits no tenderness.  Pain reproducible with deep inspiration  Abdominal: Soft. Bowel sounds are normal.  Musculoskeletal:       Thoracic back: She exhibits normal range of motion, no tenderness, no bony tenderness, no swelling, no edema, no deformity, no laceration, no pain, no spasm and normal pulse.  Neurological: She is alert and oriented to person, place, and time.  Skin: Skin is warm and dry. She is not diaphoretic.  Psychiatric: She has a normal mood and affect. Her behavior is normal. Judgment and thought content normal.  Nursing note and vitals reviewed.         Assessment & Plan:   Upper back pain on right side - Plan: DG Chest 2 View, Comprehensive metabolic panel, D-Dimer, Quantitative, DG Chest 2 View, CANCELED: D-Dimer, Quantitative  Need for influenza vaccination - Plan: Flu Vaccine QUAD 6+ mos PF IM (Fluarix Quad PF)  Acute right-sided thoracic back pain No follow-ups on file.

## 2018-01-25 NOTE — Progress Notes (Signed)
0

## 2018-01-26 ENCOUNTER — Other Ambulatory Visit: Payer: Self-pay | Admitting: Nurse Practitioner

## 2018-01-26 DIAGNOSIS — E876 Hypokalemia: Secondary | ICD-10-CM

## 2018-01-26 DIAGNOSIS — R071 Chest pain on breathing: Secondary | ICD-10-CM | POA: Diagnosis not present

## 2018-01-26 MED ORDER — POTASSIUM CHLORIDE CRYS ER 20 MEQ PO TBCR: 40.0000 meq | EXTENDED_RELEASE_TABLET | Freq: Once | ORAL | 0 refills | Status: DC

## 2018-01-26 NOTE — Assessment & Plan Note (Signed)
New-exam is reassuring. Check d dimer, CXR. ?pleuritis vs costochondritis. Orders Placed This Encounter  Procedures  . DG Chest 2 View  . Flu Vaccine QUAD 6+ mos PF IM (Fluarix Quad PF)  . Comprehensive metabolic panel  . D-Dimer, Quantitative

## 2018-06-08 ENCOUNTER — Other Ambulatory Visit: Payer: Self-pay | Admitting: Family Medicine

## 2018-06-08 DIAGNOSIS — Z1231 Encounter for screening mammogram for malignant neoplasm of breast: Secondary | ICD-10-CM

## 2018-07-06 ENCOUNTER — Ambulatory Visit
Admission: RE | Admit: 2018-07-06 | Discharge: 2018-07-06 | Disposition: A | Discharge status: Home / Self Care | Payer: Commercial Managed Care - PPO | Source: Ambulatory Visit | Attending: Family Medicine | Admitting: Family Medicine

## 2018-07-06 DIAGNOSIS — Z1231 Encounter for screening mammogram for malignant neoplasm of breast: Secondary | ICD-10-CM

## 2018-10-20 ENCOUNTER — Other Ambulatory Visit: Payer: Self-pay | Admitting: Family Medicine

## 2019-05-04 ENCOUNTER — Other Ambulatory Visit: Payer: Self-pay

## 2019-05-04 MED ORDER — AMLODIPINE BESYLATE 5 MG PO TABS: ORAL_TABLET | ORAL | 0 refills | Status: DC

## 2019-05-04 NOTE — Telephone Encounter (Signed)
Last OV 01/25/18 Last fill 10/22/18  #90/ Pt need a f/u visit before any other refills.

## 2019-05-04 NOTE — Telephone Encounter (Signed)
made in error

## 2019-06-10 NOTE — Progress Notes (Signed)
TELEPHONE ENCOUNTER   Patient verbally agreed to telephone visit and is aware that copayment and coinsurance may apply. Patient was treated using telemedicine according to accepted telemedicine protocols.  Location of the patient: Patient's home Location of provider: Provider's home Names of all persons participating in the telemedicine service and role in the encounter: Ruthe Mannan, MD Devynn Forbush  Subjective:   Chief Complaint  Patient presents with  . Hypertension     HPI Patient is connecting to follow-up- see problem based charting.  .     Patient Active Problem List   Diagnosis Date Noted  . Hypokalemia 08/14/2011  . HTN (hypertension) 07/09/2011  . History of iron deficiency anemia    Social History   Tobacco Use  . Smoking status: Never Smoker  . Smokeless tobacco: Never Used  Substance Use Topics  . Alcohol use: No    Current Outpatient Medications:  .  amLODipine (NORVASC) 5 MG tablet, Take 1qd (Plz sched wellness for Sept), Disp: 30 tablet, Rfl: 0 .  ferrous sulfate 325 (65 FE) MG EC tablet, Take 1 tablet (325 mg total) by mouth 3 (three) times daily with meals. (Patient not taking: Reported on 01/25/2018), Disp: 90 tablet, Rfl: 3 .  potassium chloride SA (K-DUR,KLOR-CON) 20 MEQ tablet, Take 2 tablets (40 mEq total) by mouth once for 1 dose., Disp: 2 tablet, Rfl: 0 Allergies  Allergen Reactions  . Iron Anaphylaxis  . Ace Inhibitors     cough     Ruthe Mannan, MD 06/10/2019  Time spent with the patient: 15 minutes, spent in obtaining information about her symptoms, reviewing her previous labs, evaluations, and treatments, counseling her about her condition (please see the discussed topics above), and developing a plan to further investigate it; she had a number of questions which I addressed.   [id number] physician/qualified health professional telephone evaluation 5 to 10 minutes [id number] physician/qualified help functional [place] evaluation for 11 to 20  minutes [id number] physician/qualify he will professional telephone evaluation for 21 to 30 minutes   Lab Results  Component Value Date   WBC 14.9 (H) 10/20/2016   HGB 10.9 (L) 10/20/2016   HCT 34.6 (L) 10/20/2016   PLT 261.0 10/20/2016   GLUCOSE 92 01/25/2018   CHOL 178 01/08/2015   TRIG 377.0 (H) 01/08/2015   HDL 38.40 (L) 01/08/2015   LDLDIRECT 89.0 01/08/2015   ALT 21 01/25/2018   AST 25 01/25/2018   NA 136 01/25/2018   K 3.3 (L) 01/25/2018   CL 101 01/25/2018   CREATININE 0.49 01/25/2018   BUN 10 01/25/2018   CO2 30 01/25/2018   TSH 3.31 01/08/2015    Lab Results  Component Value Date   TSH 3.31 01/08/2015   Lab Results  Component Value Date   WBC 14.9 (H) 10/20/2016   HGB 10.9 (L) 10/20/2016   HCT 34.6 (L) 10/20/2016   MCV 71.6 (L) 10/20/2016   PLT 261.0 10/20/2016   Lab Results  Component Value Date   NA 136 01/25/2018   K 3.3 (L) 01/25/2018   CO2 30 01/25/2018   GLUCOSE 92 01/25/2018   BUN 10 01/25/2018   CREATININE 0.49 01/25/2018   BILITOT 0.5 01/25/2018   ALKPHOS 78 01/25/2018   AST 25 01/25/2018   ALT 21 01/25/2018   PROT 7.8 01/25/2018   ALBUMIN 3.8 01/25/2018   CALCIUM 8.9 01/25/2018   GFR 144.12 01/25/2018   Lab Results  Component Value Date   CHOL 178 01/08/2015   Lab Results  Component  Value Date   HDL 38.40 (L) 01/08/2015   No results found for: [facility] Lab Results  Component Value Date   TRIG 377.0 (H) 01/08/2015   Lab Results  Component Value Date   CHOLHDL 5 01/08/2015   No results found for: HGBA1C     Assessment & Plan:   Problem List Items Addressed This Visit      Active Problems   History of iron deficiency anemia   Relevant Orders   CBC with Differential/Platelet   Iron, TIBC and Ferritin Panel   HTN (hypertension) - Primary    History: Review: taking medications as instructed, no medication side effects noted, no TIAs, no chest pain on exertion, no dyspnea on exertion, no swelling of ankles. Smoker: No.  BP  Readings from Last 3 Encounters:  01/25/18 124/84  09/09/17 130/86  10/20/16 140/90   Lab Results  Component Value Date   CREATININE 0.49 01/25/2018     Assessment/Plan: 1. Medication: no change. 2. Dietary sodium restriction. 3. Regular aerobic exercise. Future lab orders placed.  Lab Results  Component Value Date   CREATININE 0.49 01/25/2018         Relevant Orders   CBC with Differential/Platelet   Lipid Profile   Hypokalemia   Relevant Orders   Comprehensive metabolic panel      I am having Sandra Huffman maintain her ferrous sulfate, potassium chloride SA, and amLODipine.  No orders of the defined types were placed in this encounter.    Arnette Norris, MD

## 2019-06-10 NOTE — Assessment & Plan Note (Addendum)
History: Review:She ran out of her BP med yesterday- was taking amlodipine 5 mg daily. She doesn't check her BP regularly.  She is not having Sx and has not feel like it has been up.  , no TIAs, no chest pain on exertion, no dyspnea on exertion, no swelling of ankles. Smoker: No.  BP Readings from Last 3 Encounters:  01/25/18 124/84  09/09/17 130/86  10/20/16 140/90   Lab Results  Component Value Date   CREATININE 0.49 01/25/2018     Assessment/Plan: 1. Medication: no change.  eRx refills sent. Dietary sodium restriction. 2. Regular aerobic exercise. Future lab orders placed.  Lab Results  Component Value Date   CREATININE 0.49 01/25/2018

## 2019-06-13 ENCOUNTER — Other Ambulatory Visit: Payer: Self-pay

## 2019-06-13 ENCOUNTER — Ambulatory Visit (INDEPENDENT_AMBULATORY_CARE_PROVIDER_SITE_OTHER): Payer: Commercial Managed Care - PPO | Admitting: Family Medicine

## 2019-06-13 DIAGNOSIS — E876 Hypokalemia: Secondary | ICD-10-CM

## 2019-06-13 DIAGNOSIS — I10 Essential (primary) hypertension: Secondary | ICD-10-CM | POA: Diagnosis not present

## 2019-06-13 DIAGNOSIS — Z862 Personal history of diseases of the blood and blood-forming organs and certain disorders involving the immune mechanism: Secondary | ICD-10-CM | POA: Diagnosis not present

## 2019-06-13 MED ORDER — AMLODIPINE BESYLATE 5 MG PO TABS: ORAL_TABLET | ORAL | 3 refills | Status: DC

## 2019-06-13 NOTE — Assessment & Plan Note (Signed)
She had to D/C her Fe supplement as it caused her throat to close up. She now just takes a Multivitamin.  Due for labs.  She is scheduled for fasting labs on Monday.

## 2019-06-20 ENCOUNTER — Other Ambulatory Visit (INDEPENDENT_AMBULATORY_CARE_PROVIDER_SITE_OTHER): Payer: Commercial Managed Care - PPO

## 2019-06-20 ENCOUNTER — Other Ambulatory Visit: Payer: Self-pay

## 2019-06-20 DIAGNOSIS — I10 Essential (primary) hypertension: Secondary | ICD-10-CM

## 2019-06-20 DIAGNOSIS — E876 Hypokalemia: Secondary | ICD-10-CM

## 2019-06-20 DIAGNOSIS — Z862 Personal history of diseases of the blood and blood-forming organs and certain disorders involving the immune mechanism: Secondary | ICD-10-CM | POA: Diagnosis not present

## 2019-06-20 LAB — COMPREHENSIVE METABOLIC PANEL
ALT: 40 U/L — ABNORMAL HIGH (ref 0–35)
AST: 36 U/L (ref 0–37)
Albumin: 3.9 g/dL (ref 3.5–5.2)
Alkaline Phosphatase: 75 U/L (ref 39–117)
BUN: 12 mg/dL (ref 6–23)
CO2: 29 mEq/L (ref 19–32)
Calcium: 9 mg/dL (ref 8.4–10.5)
Chloride: 102 mEq/L (ref 96–112)
Creatinine, Ser: 0.55 mg/dL (ref 0.40–1.20)
GFR: 117.97 mL/min (ref >60.00)
Glucose, Bld: 85 mg/dL (ref 70–99)
Potassium: 3.4 mEq/L — ABNORMAL LOW (ref 3.5–5.1)
Sodium: 139 mEq/L (ref 135–145)
Total Bilirubin: 0.6 mg/dL (ref 0.2–1.2)
Total Protein: 7.6 g/dL (ref 6.0–8.3)

## 2019-06-20 LAB — CBC WITH DIFFERENTIAL/PLATELET
Basophils Absolute: 0.1 10*3/uL (ref 0.0–0.1)
Basophils Relative: 1.2 % (ref 0.0–3.0)
Eosinophils Absolute: 1 10*3/uL — ABNORMAL HIGH (ref 0.0–0.7)
Eosinophils Relative: 13.3 % — ABNORMAL HIGH (ref 0.0–5.0)
HCT: 39.9 % (ref 36.0–46.0)
Hemoglobin: 12.9 g/dL (ref 12.0–15.0)
Lymphocytes Relative: 26.9 % (ref 12.0–46.0)
Lymphs Abs: 2 10*3/uL (ref 0.7–4.0)
MCHC: 32.2 g/dL (ref 30.0–36.0)
MCV: 78.5 fl (ref 78.0–100.0)
Monocytes Absolute: 0.6 10*3/uL (ref 0.1–1.0)
Monocytes Relative: 8.1 % (ref 3.0–12.0)
Neutro Abs: 3.7 10*3/uL (ref 1.4–7.7)
Neutrophils Relative %: 50.5 % (ref 43.0–77.0)
Platelets: 217 10*3/uL (ref 150.0–400.0)
RBC: 5.09 Mil/uL (ref 3.87–5.11)
RDW: 16.6 % — ABNORMAL HIGH (ref 11.5–15.5)
WBC: 7.3 10*3/uL (ref 4.0–10.5)

## 2019-06-20 LAB — LIPID PANEL
Cholesterol: 242 mg/dL — ABNORMAL HIGH (ref 0–200)
HDL: 55 mg/dL (ref >39.00)
LDL Cholesterol: 151 mg/dL — ABNORMAL HIGH (ref 0–99)
NonHDL: 186.69
Total CHOL/HDL Ratio: 4
Triglycerides: 179 mg/dL — ABNORMAL HIGH (ref 0.0–149.0)
VLDL: 35.8 mg/dL (ref 0.0–40.0)

## 2019-06-21 LAB — IRON,TIBC AND FERRITIN PANEL
%SAT: 25 % (calc) (ref 16–45)
Ferritin: 16 ng/mL (ref 16–232)
Iron: 85 ug/dL (ref 40–190)
TIBC: 344 mcg/dL (calc) (ref 250–450)

## 2019-09-07 ENCOUNTER — Other Ambulatory Visit: Payer: Self-pay

## 2019-09-07 ENCOUNTER — Telehealth (INDEPENDENT_AMBULATORY_CARE_PROVIDER_SITE_OTHER): Payer: Commercial Managed Care - PPO | Admitting: Nurse Practitioner

## 2019-09-07 ENCOUNTER — Encounter: Payer: Self-pay | Admitting: Nurse Practitioner

## 2019-09-07 VITALS — Ht 61.0 in | Wt 132.0 lb

## 2019-09-07 DIAGNOSIS — J0100 Acute maxillary sinusitis, unspecified: Secondary | ICD-10-CM | POA: Diagnosis not present

## 2019-09-07 MED ORDER — AZITHROMYCIN 250 MG PO TABS: 250.0000 mg | ORAL_TABLET | Freq: Every day | ORAL | 0 refills | Status: DC

## 2019-09-07 MED ORDER — MUCINEX 600 MG PO TB12: 600.0000 mg | ORAL_TABLET | Freq: Two times a day (BID) | ORAL | 0 refills | Status: DC | PRN

## 2019-09-07 MED ORDER — FLUTICASONE PROPIONATE 50 MCG/ACT NA SUSP: 2.0000 | Freq: Every day | NASAL | 0 refills | Status: DC

## 2019-09-07 NOTE — Patient Instructions (Signed)
flonase only for at least 14days. Encourage adequate oral hydration. Use mucinex for 1week You can use plain "Tylenol" or "Advi"l for fever, chills and achyness.

## 2019-09-07 NOTE — Progress Notes (Signed)
Virtual Visit via Video Note  I connected with@ on 09/07/19 at 10:00 AM EDT by a video enabled telemedicine application and verified that I am speaking with the correct person using two identifiers.  Location: Patient:Home Provider: Office Participants: patient and provider  I discussed the limitations of evaluation and management by telemedicine and the availability of in person appointments. I also discussed with the patient that there may be a patient responsible charge related to this service. The patient expressed understanding and agreed to proceed.  CC:pt said she suffers from allergies but sinus been bothering her for over month//takes OTC allergy meds and theraflu but doesn't help//notice blood in mucus this morn when she spit//no means for vitals.  History of Present Illness: Sinusitis This is a new problem. The current episode started more than 1 month ago. The problem has been gradually worsening since onset. There has been no fever. Associated symptoms include congestion, sinus pressure and sneezing. Pertinent negatives include no chills, coughing, diaphoresis, ear pain, headaches, hoarse voice, neck pain, shortness of breath, sore throat or swollen glands. Past treatments include acetaminophen and oral decongestants. The treatment provided mild relief.  no recent travel   Observations/Objective: Physical Exam  Constitutional: She is oriented to person, place, and time. No distress.  Eyes: Conjunctivae and EOM are normal.  Pulmonary/Chest: Effort normal.  Musculoskeletal:     Cervical back: Normal range of motion and neck supple.  Neurological: She is alert and oriented to person, place, and time.   Assessment and Plan: [name] was seen today for sinusitis.  Diagnoses and all orders for this visit:  Acute non-recurrent maxillary sinusitis -     guaiFENesin (MUCINEX) 600 MG 12 hr tablet; Take 1 tablet (600 mg total) by mouth 2 (two) times daily as needed for cough or to  loosen phlegm. -     fluticasone (FLONASE) 50 MCG/ACT nasal spray; Place 2 sprays into both nostrils daily. -     azithromycin (ZITHROMAX Z-PAK) 250 MG tablet; Take 1 tablet (250 mg total) by mouth daily. Take 2tabs on first day, then 1tab once a day till complete   Follow Up Instructions: flonase only for at least 14days. Encourage adequate oral hydration. Use mucinex for 1week You can use plain "Tylenol" or "Advi"l for fever, chills and achyness.   I discussed the assessment and treatment plan with the patient. The patient was provided an opportunity to ask questions and all were answered. The patient agreed with the plan and demonstrated an understanding of the instructions.   The patient was advised to call back or seek an in-person evaluation if the symptoms worsen or if the condition fails to improve as anticipated.   Sandra Penna, NP

## 2019-09-14 ENCOUNTER — Other Ambulatory Visit: Payer: Self-pay | Admitting: Family Medicine

## 2019-09-14 DIAGNOSIS — Z1231 Encounter for screening mammogram for malignant neoplasm of breast: Secondary | ICD-10-CM

## 2019-10-05 ENCOUNTER — Other Ambulatory Visit: Payer: Self-pay | Admitting: Nurse Practitioner

## 2019-10-05 DIAGNOSIS — J0100 Acute maxillary sinusitis, unspecified: Secondary | ICD-10-CM

## 2019-10-17 ENCOUNTER — Ambulatory Visit (INDEPENDENT_AMBULATORY_CARE_PROVIDER_SITE_OTHER): Payer: Commercial Managed Care - PPO | Admitting: Nurse Practitioner

## 2019-10-17 ENCOUNTER — Encounter: Payer: Self-pay | Admitting: Nurse Practitioner

## 2019-10-17 ENCOUNTER — Ambulatory Visit
Admission: RE | Admit: 2019-10-17 | Discharge: 2019-10-17 | Disposition: A | Discharge status: Home / Self Care | Payer: Commercial Managed Care - PPO | Source: Ambulatory Visit | Attending: Family Medicine | Admitting: Family Medicine

## 2019-10-17 ENCOUNTER — Other Ambulatory Visit: Payer: Self-pay

## 2019-10-17 VITALS — BP 146/92 | HR 77 | Temp 97.9°F | Ht 61.0 in | Wt 137.6 lb

## 2019-10-17 DIAGNOSIS — I10 Essential (primary) hypertension: Secondary | ICD-10-CM | POA: Diagnosis not present

## 2019-10-17 DIAGNOSIS — N3001 Acute cystitis with hematuria: Secondary | ICD-10-CM | POA: Diagnosis not present

## 2019-10-17 DIAGNOSIS — J302 Other seasonal allergic rhinitis: Secondary | ICD-10-CM | POA: Diagnosis not present

## 2019-10-17 DIAGNOSIS — Z1231 Encounter for screening mammogram for malignant neoplasm of breast: Secondary | ICD-10-CM

## 2019-10-17 LAB — POCT URINALYSIS DIPSTICK
Bilirubin, UA: NEGATIVE
Glucose, UA: NEGATIVE
Ketones, UA: NEGATIVE
Nitrite, UA: NEGATIVE
Protein, UA: POSITIVE — AB
Spec Grav, UA: 1.02 (ref 1.010–1.025)
Urobilinogen, UA: 0.2 E.U./dL
pH, UA: 6 (ref 5.0–8.0)

## 2019-10-17 MED ORDER — SALINE SPRAY 0.65 % NA SOLN: 1.0000 | NASAL | 0 refills | Status: DC | PRN

## 2019-10-17 MED ORDER — AMOXICILLIN-POT CLAVULANATE 875-125 MG PO TABS: 1.0000 | ORAL_TABLET | Freq: Two times a day (BID) | ORAL | 0 refills | Status: DC

## 2019-10-17 MED ORDER — FLUCONAZOLE 150 MG PO TABS: 150.0000 mg | ORAL_TABLET | Freq: Once | ORAL | 0 refills | Status: AC

## 2019-10-17 MED ORDER — CETIRIZINE HCL 10 MG PO TABS: 10.0000 mg | ORAL_TABLET | Freq: Every day | ORAL | 0 refills | Status: DC

## 2019-10-17 MED ORDER — FLUTICASONE PROPIONATE 50 MCG/ACT NA SUSP: NASAL | 0 refills | Status: DC

## 2019-10-17 NOTE — Progress Notes (Signed)
Subjective:  Patient ID: Sandra Huffman, female    DOB: 04-Mar-1971  Age: 49 y.o. MRN: [id number]  CC: Establish Care (TOC from [facility]--sinus issue still going on isnce 08/2014--and UTI--burning sensation--used otc)  Sinusitis This is a recurrent problem. The current episode started more than 1 month ago. The problem has been waxing and waning since onset. There has been no fever. She is experiencing no pain. Associated symptoms include congestion, sinus pressure and sneezing. Pertinent negatives include no chills, coughing, diaphoresis, ear pain, headaches, hoarse voice, neck pain, shortness of breath, sore throat or swollen glands. Past treatments include saline sprays and antibiotics. The treatment provided moderate relief.  Urinary Tract Infection  This is a new problem. The current episode started in the past 7 days. The problem occurs every urination. The problem has been unchanged. The quality of the pain is described as burning. There has been no fever. She is sexually active. There is no history of pyelonephritis. Associated symptoms include frequency, hematuria and urgency. Pertinent negatives include no chills, discharge, flank pain, hesitancy, nausea, possible pregnancy, sweats or vomiting. She has tried nothing for the symptoms. There is no history of kidney stones, recurrent UTIs or urinary stasis.    Reviewed past Medical, Social and Family history today.  Outpatient Medications Prior to Visit  Medication Sig Dispense Refill   amLODipine (NORVASC) 5 MG tablet Take 1qd 90 tablet 3   Multiple Vitamin (MULTIVITAMIN) tablet Take 1 tablet by mouth daily.     fluticasone (FLONASE) 50 MCG/ACT nasal spray SHAKE LIQUID AND USE 2 SPRAYS IN EACH NOSTRIL DAILY 16 g 0   azithromycin (ZITHROMAX Z-PAK) 250 MG tablet Take 1 tablet (250 mg total) by mouth daily. Take 2tabs on first day, then 1tab once a day till complete (Patient not taking: Reported on 10/17/2019) 6 tablet 0   guaiFENesin (MUCINEX) 600  MG 12 hr tablet Take 1 tablet (600 mg total) by mouth 2 (two) times daily as needed for cough or to loosen phlegm. (Patient not taking: Reported on 10/17/2019) 14 tablet 0   No facility-administered medications prior to visit.    ROS See HPI  Objective:  BP (!) 146/92    Pulse 77    Temp 97.9 F (36.6 C) (Tympanic)    Ht 5\' 1"  (1.549 m)    Wt 137 lb 9.6 oz (62.4 kg)    SpO2 99%    BMI 26.00 kg/m   BP Readings from Last 3 Encounters:  10/17/19 (!) 146/92  01/25/18 124/84  09/09/17 130/86    Wt Readings from Last 3 Encounters:  10/17/19 137 lb 9.6 oz (62.4 kg)  09/07/19 132 lb (59.9 kg)  01/25/18 130 lb (59 kg)    Physical Exam Vitals reviewed.  Cardiovascular:     Rate and Rhythm: Normal rate.     Pulses: Normal pulses.  Pulmonary:     Effort: Pulmonary effort is normal.  Abdominal:     General: There is no distension.     Palpations: Abdomen is soft.     Tenderness: There is no abdominal tenderness.  Neurological:     Mental Status: She is alert and oriented to person, place, and time.     Lab Results  Component Value Date   WBC 7.3 06/20/2019   HGB 12.9 06/20/2019   HCT 39.9 06/20/2019   PLT 217.0 06/20/2019   GLUCOSE 85 06/20/2019   CHOL 242 (H) 06/20/2019   TRIG 179.0 (H) 06/20/2019   HDL 55.00 06/20/2019   LDLDIRECT  89.0 01/08/2015   LDLCALC 151 (H) 06/20/2019   ALT 40 (H) 06/20/2019   AST 36 06/20/2019   NA 139 06/20/2019   K 3.4 (L) 06/20/2019   CL 102 06/20/2019   CREATININE 0.55 06/20/2019   BUN 12 06/20/2019   CO2 29 06/20/2019   TSH 3.31 01/08/2015    Assessment & Plan:  This visit occurred during the SARS-CoV-2 public health emergency.  Safety protocols were in place, including screening questions prior to the visit, additional usage of staff PPE, and extensive cleaning of exam room while observing appropriate contact time as indicated for disinfecting solutions.   Sandra Huffman was seen today for establish care.  Diagnoses and all orders for this  visit:  Acute cystitis with hematuria -     POCT urinalysis dipstick -     Urine Culture -     amoxicillin-clavulanate (AUGMENTIN) 875-125 MG tablet; Take 1 tablet by mouth 2 (two) times daily. -     fluconazole (DIFLUCAN) 150 MG tablet; Take 1 tablet (150 mg total) by mouth once for 1 dose.  Essential hypertension  Seasonal allergic rhinitis, unspecified trigger -     cetirizine (ZYRTEC) 10 MG tablet; Take 1 tablet (10 mg total) by mouth at bedtime. -     sodium chloride (OCEAN) 0.65 % SOLN nasal spray; Place 1 spray into both nostrils as needed for congestion. -     fluticasone (FLONASE) 50 MCG/ACT nasal spray; SHAKE LIQUID AND USE 2 SPRAYS IN EACH NOSTRIL DAILY   I have discontinued Brook Hinojos's Mucinex and azithromycin. I am also having her start on amoxicillin-clavulanate, cetirizine, sodium chloride, and fluconazole. Additionally, I am having her maintain her multivitamin, amLODipine, and fluticasone.  Meds ordered this encounter  Medications   amoxicillin-clavulanate (AUGMENTIN) 875-125 MG tablet    Sig: Take 1 tablet by mouth 2 (two) times daily.    Dispense:  14 tablet    Refill:  0    Order Specific Question:   Supervising Provider    Answer:   Overton Mam [[id number]]   cetirizine (ZYRTEC) 10 MG tablet    Sig: Take 1 tablet (10 mg total) by mouth at bedtime.    Dispense:  90 tablet    Refill:  0    Order Specific Question:   Supervising Provider    Answer:   Overton Mam [[id number]]   sodium chloride (OCEAN) 0.65 % SOLN nasal spray    Sig: Place 1 spray into both nostrils as needed for congestion.    Dispense:  15 mL    Refill:  0    Order Specific Question:   Supervising Provider    Answer:   Overton Mam [[id number]]   fluticasone (FLONASE) 50 MCG/ACT nasal spray    Sig: SHAKE LIQUID AND USE 2 SPRAYS IN EACH NOSTRIL DAILY    Dispense:  16 g    Refill:  0    Order Specific Question:   Supervising Provider    Answer:   Overton Mam [[id number]]    fluconazole (DIFLUCAN) 150 MG tablet    Sig: Take 1 tablet (150 mg total) by mouth once for 1 dose.    Dispense:  1 tablet    Refill:  0    Order Specific Question:   Supervising Provider    Answer:   Overton Mam [[id number]]    Problem List Items Addressed This Visit      Cardiovascular and Mediastinum   HTN (hypertension)     Respiratory  Seasonal allergic rhinitis   Relevant Medications   cetirizine (ZYRTEC) 10 MG tablet   sodium chloride (OCEAN) 0.65 % SOLN nasal spray   fluticasone (FLONASE) 50 MCG/ACT nasal spray    Other Visit Diagnoses    Acute cystitis with hematuria    -  Primary   Relevant Medications   amoxicillin-clavulanate (AUGMENTIN) 875-125 MG tablet   fluconazole (DIFLUCAN) 150 MG tablet   Other Relevant Orders   POCT urinalysis dipstick (Completed)   Urine Culture      Follow-up: Return in about 3 months (around 01/17/2020) for HTN.  Wilfred Lacy, NP

## 2019-10-17 NOTE — Patient Instructions (Addendum)
Continue flonase daily Stop allegra Start zyrtec daily.  Start oral abx for UTI Maintain adequate oral hydration. You will be contacted with urine culture results  Monitor BP at home 2-3x/week in AM Call office if BP>140/90 for more than 48hrs Maintain DASH diet.   DASH Eating Plan DASH stands for "Dietary Approaches to Stop Hypertension." The DASH eating plan is a healthy eating plan that has been shown to reduce high blood pressure (hypertension). It may also reduce your risk for type 2 diabetes, heart disease, and stroke. The DASH eating plan may also help with weight loss. What are tips for following this plan?  General guidelines  Avoid eating more than 2,300 mg (milligrams) of salt (sodium) a day. If you have hypertension, you may need to reduce your sodium intake to 1,500 mg a day.  Limit alcohol intake to no more than 1 drink a day for nonpregnant women and 2 drinks a day for men. One drink equals 12 oz of beer, 5 oz of wine, or 1 oz of hard liquor.  Work with your health care provider to maintain a healthy body weight or to lose weight. Ask what an ideal weight is for you.  Get at least 30 minutes of exercise that causes your heart to beat faster (aerobic exercise) most days of the week. Activities may include walking, swimming, or biking.  Work with your health care provider or diet and nutrition specialist (dietitian) to adjust your eating plan to your individual calorie needs. Reading food labels   Check food labels for the amount of sodium per serving. Choose foods with less than 5 percent of the Daily Value of sodium. Generally, foods with less than 300 mg of sodium per serving fit into this eating plan.  To find whole grains, look for the word "whole" as the first word in the ingredient list. Shopping  Buy products labeled as "low-sodium" or "no salt added."  Buy fresh foods. Avoid canned foods and premade or frozen meals. Cooking  Avoid adding salt when  cooking. Use salt-free seasonings or herbs instead of table salt or sea salt. Check with your health care provider or pharmacist before using salt substitutes.  Do not fry foods. Cook foods using healthy methods such as baking, boiling, grilling, and broiling instead.  Cook with heart-healthy oils, such as olive, canola, soybean, or sunflower oil. Meal planning  Eat a balanced diet that includes: ? 5 or more servings of fruits and vegetables each day. At each meal, try to fill half of your plate with fruits and vegetables. ? Up to 6-8 servings of whole grains each day. ? Less than 6 oz of lean meat, poultry, or fish each day. A 3-oz serving of meat is about the same size as a deck of cards. One egg equals 1 oz. ? 2 servings of low-fat dairy each day. ? A serving of nuts, seeds, or beans 5 times each week. ? Heart-healthy fats. Healthy fats called Omega-3 fatty acids are found in foods such as flaxseeds and coldwater fish, like sardines, salmon, and mackerel.  Limit how much you eat of the following: ? Canned or prepackaged foods. ? Food that is high in trans fat, such as fried foods. ? Food that is high in saturated fat, such as fatty meat. ? Sweets, desserts, sugary drinks, and other foods with added sugar. ? Full-fat dairy products.  Do not salt foods before eating.  Try to eat at least 2 vegetarian meals each week.  Eat more  home-cooked food and less restaurant, buffet, and fast food.  When eating at a restaurant, ask that your food be prepared with less salt or no salt, if possible. What foods are recommended? The items listed may not be a complete list. Talk with your dietitian about what dietary choices are best for you. Grains Whole-grain or whole-wheat bread. Whole-grain or whole-wheat pasta. Brown rice. Sandra Huffman. Bulgur. Whole-grain and low-sodium cereals. Pita bread. Low-fat, low-sodium crackers. Whole-wheat flour tortillas. Vegetables Fresh or frozen vegetables  (raw, steamed, roasted, or grilled). Low-sodium or reduced-sodium tomato and vegetable juice. Low-sodium or reduced-sodium tomato sauce and tomato paste. Low-sodium or reduced-sodium canned vegetables. Fruits All fresh, dried, or frozen fruit. Canned fruit in natural juice (without added sugar). Meat and other protein foods Skinless chicken or [place]. Ground chicken or [place]. Pork with fat trimmed off. Fish and seafood. Egg whites. Dried beans, peas, or lentils. Unsalted nuts, nut butters, and seeds. Unsalted canned beans. Lean cuts of beef with fat trimmed off. Low-sodium, lean deli meat. Dairy Low-fat (1%) or fat-free (skim) milk. Fat-free, low-fat, or reduced-fat cheeses. Nonfat, low-sodium ricotta or cottage cheese. Low-fat or nonfat yogurt. Low-fat, low-sodium cheese. Fats and oils Soft margarine without trans fats. Vegetable oil. Low-fat, reduced-fat, or light mayonnaise and salad dressings (reduced-sodium). Canola, safflower, olive, soybean, and sunflower oils. Avocado. Seasoning and other foods Herbs. Spices. Seasoning mixes without salt. Unsalted popcorn and pretzels. Fat-free sweets. What foods are not recommended? The items listed may not be a complete list. Talk with your dietitian about what dietary choices are best for you. Grains Baked goods made with fat, such as croissants, muffins, or some breads. Dry pasta or rice meal packs. Vegetables Creamed or fried vegetables. Vegetables in a cheese sauce. Regular canned vegetables (not low-sodium or reduced-sodium). Regular canned tomato sauce and paste (not low-sodium or reduced-sodium). Regular tomato and vegetable juice (not low-sodium or reduced-sodium). Sandra Huffman. Olives. Fruits Canned fruit in a light or heavy syrup. Fried fruit. Fruit in cream or butter sauce. Meat and other protein foods Fatty cuts of meat. Ribs. Fried meat. Sandra Huffman. Sausage. Bologna and other processed lunch meats. Salami. Fatback. Hotdogs. Bratwurst. Salted nuts  and seeds. Canned beans with added salt. Canned or smoked fish. Whole eggs or egg yolks. Chicken or [place] with skin. Dairy Whole or 2% milk, cream, and half-and-half. Whole or full-fat cream cheese. Whole-fat or sweetened yogurt. Full-fat cheese. Nondairy creamers. Whipped toppings. Processed cheese and cheese spreads. Fats and oils Butter. Stick margarine. Lard. Shortening. Ghee. Bacon fat. Tropical oils, such as coconut, palm kernel, or palm oil. Seasoning and other foods Salted popcorn and pretzels. Onion salt, garlic salt, seasoned salt, table salt, and sea salt. Worcestershire sauce. Tartar sauce. Barbecue sauce. Teriyaki sauce. Soy sauce, including reduced-sodium. Steak sauce. Canned and packaged gravies. Fish sauce. Oyster sauce. Cocktail sauce. Horseradish that you find on the shelf. Ketchup. Mustard. Meat flavorings and tenderizers. Bouillon cubes. Hot sauce and Tabasco sauce. Premade or packaged marinades. Premade or packaged taco seasonings. Relishes. Regular salad dressings. Where to find more information:  National Heart, Lung, and [place]: https://wilson-eaton.com/  American Heart Association: www.heart.org Summary  The DASH eating plan is a healthy eating plan that has been shown to reduce high blood pressure (hypertension). It may also reduce your risk for type 2 diabetes, heart disease, and stroke.  With the DASH eating plan, you should limit salt (sodium) intake to 2,300 mg a day. If you have hypertension, you may need to reduce your sodium intake to 1,500  mg a day.  When on the DASH eating plan, aim to eat more fresh fruits and vegetables, whole grains, lean proteins, low-fat dairy, and heart-healthy fats.  Work with your health care provider or diet and nutrition specialist (dietitian) to adjust your eating plan to your individual calorie needs. This information is not intended to replace advice given to you by your health care provider. Make sure you discuss any questions  you have with your health care provider. Document Revised: 04/10/2017 Document Reviewed: 04/21/2016 Elsevier Patient Education  2020 [employer].

## 2019-10-18 LAB — URINE CULTURE
MICRO NUMBER:: 10560748
SPECIMEN QUALITY:: ADEQUATE

## 2019-10-19 ENCOUNTER — Other Ambulatory Visit: Payer: Self-pay | Admitting: Nurse Practitioner

## 2019-10-19 ENCOUNTER — Other Ambulatory Visit: Payer: Self-pay | Admitting: Family Medicine

## 2019-10-19 DIAGNOSIS — R928 Other abnormal and inconclusive findings on diagnostic imaging of breast: Secondary | ICD-10-CM

## 2019-11-08 ENCOUNTER — Telehealth: Payer: Self-pay | Admitting: Nurse Practitioner

## 2019-11-08 DIAGNOSIS — N760 Acute vaginitis: Secondary | ICD-10-CM

## 2019-11-08 MED ORDER — FLUCONAZOLE 150 MG PO TABS: 150.0000 mg | ORAL_TABLET | Freq: Every day | ORAL | 0 refills | Status: DC

## 2019-11-08 NOTE — Telephone Encounter (Signed)
Charlotte please advise.  Pt states she thinks she has a yeast infection with the symptoms shes having the itching and burning. Pt is wondering if you could send her something in or if she would have to come back in since she was in on 10/17/19.

## 2019-11-08 NOTE — Telephone Encounter (Signed)
Diflucan sent

## 2019-11-08 NOTE — Telephone Encounter (Signed)
Pt notified of Rx and verbally understood to pick it up at [facility].

## 2019-11-08 NOTE — Telephone Encounter (Signed)
Patient is calling and stated that she is still having symptoms of a yeast infection and wanted to see if something can be sent to [facility] on [address]. CB is [phone]

## 2019-11-21 ENCOUNTER — Ambulatory Visit
Admission: RE | Admit: 2019-11-21 | Discharge: 2019-11-21 | Disposition: A | Discharge status: Home / Self Care | Payer: Commercial Managed Care - PPO | Source: Ambulatory Visit | Attending: Nurse Practitioner | Admitting: Nurse Practitioner

## 2019-11-21 ENCOUNTER — Other Ambulatory Visit: Payer: Self-pay | Admitting: Nurse Practitioner

## 2019-11-21 ENCOUNTER — Other Ambulatory Visit: Payer: Self-pay

## 2019-11-21 DIAGNOSIS — R928 Other abnormal and inconclusive findings on diagnostic imaging of breast: Secondary | ICD-10-CM

## 2019-11-21 DIAGNOSIS — R599 Enlarged lymph nodes, unspecified: Secondary | ICD-10-CM

## 2019-11-30 ENCOUNTER — Other Ambulatory Visit: Payer: Self-pay | Admitting: Nurse Practitioner

## 2019-11-30 DIAGNOSIS — J302 Other seasonal allergic rhinitis: Secondary | ICD-10-CM

## 2019-12-26 ENCOUNTER — Other Ambulatory Visit: Payer: Self-pay | Admitting: Nurse Practitioner

## 2019-12-26 DIAGNOSIS — J302 Other seasonal allergic rhinitis: Secondary | ICD-10-CM

## 2020-01-11 ENCOUNTER — Other Ambulatory Visit: Payer: Self-pay

## 2020-01-12 ENCOUNTER — Other Ambulatory Visit: Payer: Self-pay

## 2020-01-12 ENCOUNTER — Encounter: Payer: Self-pay | Admitting: Nurse Practitioner

## 2020-01-12 ENCOUNTER — Ambulatory Visit (INDEPENDENT_AMBULATORY_CARE_PROVIDER_SITE_OTHER): Payer: Commercial Managed Care - PPO | Admitting: Nurse Practitioner

## 2020-01-12 VITALS — BP 120/80 | HR 84 | Temp 97.6°F | Ht 61.0 in | Wt 137.6 lb

## 2020-01-12 DIAGNOSIS — J302 Other seasonal allergic rhinitis: Secondary | ICD-10-CM | POA: Diagnosis not present

## 2020-01-12 DIAGNOSIS — I10 Essential (primary) hypertension: Secondary | ICD-10-CM | POA: Diagnosis not present

## 2020-01-12 MED ORDER — AMLODIPINE BESYLATE 5 MG PO TABS: ORAL_TABLET | ORAL | 3 refills | Status: DC

## 2020-01-12 MED ORDER — CETIRIZINE HCL 10 MG PO TABS: 10.0000 mg | ORAL_TABLET | Freq: Every day | ORAL | 1 refills | Status: DC

## 2020-01-12 NOTE — Patient Instructions (Signed)
Maintain current medications Monitor BP in AM 2-3x/week and record. Send BP readings through mychart in 1-2weeks.

## 2020-01-12 NOTE — Progress Notes (Signed)
Subjective:  Patient ID: Sandra Huffman, female    DOB: 1971-04-17  Age: 49 y.o. MRN: [id number]  CC: Follow-up (3 month f/u on HTN, pt has not been keeping a log but states she checked BP two days ago and it was 155/103)  HPI  HTN (hypertension) BP at goal with amlodipine She reports elevated BP reading at home with father's machine. Denies any headache or dizziness or LE edema BP Readings from Last 3 Encounters:  01/12/20 120/80  10/17/19 (!) 146/92  01/25/18 124/84   Maintain current medications Monitor BP in AM 2-3x/week and record. Send BP readings through mychart in 1-2weeks.  Reviewed past Medical, Social and Family history today.  Outpatient Medications Prior to Visit  Medication Sig Dispense Refill  . fluticasone (FLONASE) 50 MCG/ACT nasal spray SHAKE LIQUID AND USE 2 SPRAYS IN EACH NOSTRIL DAILY 16 g 0  . Multiple Vitamin (MULTIVITAMIN) tablet Take 1 tablet by mouth daily.    . sodium chloride (OCEAN) 0.65 % SOLN nasal spray Place 1 spray into both nostrils as needed for congestion. 15 mL 0  . amLODipine (NORVASC) 5 MG tablet Take 1qd 90 tablet 3  . cetirizine (ZYRTEC) 10 MG tablet Take 1 tablet (10 mg total) by mouth at bedtime. 90 tablet 0  . fluconazole (DIFLUCAN) 150 MG tablet Take 1 tablet (150 mg total) by mouth daily. Take second tab 3days apart from first tab 2 tablet 0  . amoxicillin-clavulanate (AUGMENTIN) 875-125 MG tablet Take 1 tablet by mouth 2 (two) times daily. (Patient not taking: Reported on 01/12/2020) 14 tablet 0   No facility-administered medications prior to visit.    ROS See HPI  Objective:  BP 120/80 (BP Location: Right Arm, Cuff Size: Normal)   Pulse 84   Temp 97.6 F (36.4 C) (Temporal)   Ht 5\' 1"  (1.549 m)   Wt 137 lb 9.6 oz (62.4 kg)   SpO2 99%   BMI 26.00 kg/m   Physical Exam Vitals reviewed.  Cardiovascular:     Rate and Rhythm: Normal rate and regular rhythm.     Pulses: Normal pulses.  Pulmonary:     Effort: Pulmonary effort is  normal.     Breath sounds: Normal breath sounds.  Musculoskeletal:     Right lower leg: No edema.     Left lower leg: No edema.  Neurological:     Mental Status: She is alert and oriented to person, place, and time.  Psychiatric:        Mood and Affect: Mood normal.        Behavior: Behavior normal.     Assessment & Plan:  This visit occurred during the SARS-CoV-2 public health emergency.  Safety protocols were in place, including screening questions prior to the visit, additional usage of staff PPE, and extensive cleaning of exam room while observing appropriate contact time as indicated for disinfecting solutions.   Jaslyne was seen today for follow-up.  Diagnoses and all orders for this visit:  Essential hypertension -     amLODipine (NORVASC) 5 MG tablet; Take 1qd  Seasonal allergic rhinitis, unspecified trigger -     cetirizine (ZYRTEC) 10 MG tablet; Take 1 tablet (10 mg total) by mouth at bedtime.    Problem List Items Addressed This Visit      Cardiovascular and Mediastinum   HTN (hypertension) - Primary    BP at goal with amlodipine She reports elevated BP reading at home with father's machine. Denies any headache or dizziness or LE  edema BP Readings from Last 3 Encounters:  01/12/20 120/80  10/17/19 (!) 146/92  01/25/18 124/84   Maintain current medications Monitor BP in AM 2-3x/week and record. Send BP readings through mychart in 1-2weeks.      Relevant Medications   amLODipine (NORVASC) 5 MG tablet     Respiratory   Seasonal allergic rhinitis   Relevant Medications   cetirizine (ZYRTEC) 10 MG tablet       Follow-up: Return in about 3 months (around 04/12/2020) for CPE (fasting).  [name], NP

## 2020-01-20 ENCOUNTER — Encounter: Payer: Self-pay | Admitting: Nurse Practitioner

## 2020-01-20 NOTE — Assessment & Plan Note (Signed)
BP at goal with amlodipine She reports elevated BP reading at home with father's machine. Denies any headache or dizziness or LE edema BP Readings from Last 3 Encounters:  01/12/20 120/80  10/17/19 (!) 146/92  01/25/18 124/84   Maintain current medications Monitor BP in AM 2-3x/week and record. Send BP readings through mychart in 1-2weeks.

## 2020-01-21 ENCOUNTER — Other Ambulatory Visit: Payer: Self-pay | Admitting: Nurse Practitioner

## 2020-01-21 DIAGNOSIS — J302 Other seasonal allergic rhinitis: Secondary | ICD-10-CM

## 2020-01-23 ENCOUNTER — Ambulatory Visit
Admission: RE | Admit: 2020-01-23 | Discharge: 2020-01-23 | Disposition: A | Discharge status: Home / Self Care | Payer: Commercial Managed Care - PPO | Source: Ambulatory Visit | Attending: Nurse Practitioner | Admitting: Nurse Practitioner

## 2020-01-23 ENCOUNTER — Other Ambulatory Visit: Payer: Self-pay | Admitting: Nurse Practitioner

## 2020-01-23 ENCOUNTER — Other Ambulatory Visit: Payer: Self-pay

## 2020-01-23 DIAGNOSIS — R599 Enlarged lymph nodes, unspecified: Secondary | ICD-10-CM

## 2020-02-21 ENCOUNTER — Other Ambulatory Visit: Payer: Self-pay | Admitting: Nurse Practitioner

## 2020-02-21 DIAGNOSIS — J302 Other seasonal allergic rhinitis: Secondary | ICD-10-CM

## 2020-03-28 ENCOUNTER — Other Ambulatory Visit: Payer: Self-pay | Admitting: Nurse Practitioner

## 2020-03-28 DIAGNOSIS — J302 Other seasonal allergic rhinitis: Secondary | ICD-10-CM

## 2020-04-16 ENCOUNTER — Encounter: Payer: Self-pay | Admitting: Nurse Practitioner

## 2020-04-16 ENCOUNTER — Ambulatory Visit (INDEPENDENT_AMBULATORY_CARE_PROVIDER_SITE_OTHER): Payer: Commercial Managed Care - PPO | Admitting: Nurse Practitioner

## 2020-04-16 ENCOUNTER — Other Ambulatory Visit (HOSPITAL_COMMUNITY)
Admission: RE | Admit: 2020-04-16 | Discharge: 2020-04-16 | Disposition: A | Discharge status: Home / Self Care | Payer: Commercial Managed Care - PPO | Source: Ambulatory Visit | Attending: Nurse Practitioner | Admitting: Nurse Practitioner

## 2020-04-16 ENCOUNTER — Other Ambulatory Visit: Payer: Self-pay

## 2020-04-16 VITALS — BP 140/90 | HR 88 | Temp 98.1°F | Ht 61.0 in | Wt 137.0 lb

## 2020-04-16 DIAGNOSIS — E876 Hypokalemia: Secondary | ICD-10-CM

## 2020-04-16 DIAGNOSIS — Z0001 Encounter for general adult medical examination with abnormal findings: Secondary | ICD-10-CM | POA: Diagnosis not present

## 2020-04-16 DIAGNOSIS — J302 Other seasonal allergic rhinitis: Secondary | ICD-10-CM

## 2020-04-16 DIAGNOSIS — I10 Essential (primary) hypertension: Secondary | ICD-10-CM | POA: Diagnosis not present

## 2020-04-16 DIAGNOSIS — Z124 Encounter for screening for malignant neoplasm of cervix: Secondary | ICD-10-CM | POA: Diagnosis not present

## 2020-04-16 DIAGNOSIS — J324 Chronic pansinusitis: Secondary | ICD-10-CM | POA: Diagnosis not present

## 2020-04-16 DIAGNOSIS — E782 Mixed hyperlipidemia: Secondary | ICD-10-CM | POA: Insufficient documentation

## 2020-04-16 DIAGNOSIS — Z862 Personal history of diseases of the blood and blood-forming organs and certain disorders involving the immune mechanism: Secondary | ICD-10-CM

## 2020-04-16 LAB — BASIC METABOLIC PANEL
BUN: 11 mg/dL (ref 6–23)
CO2: 27 mEq/L (ref 19–32)
Calcium: 8.8 mg/dL (ref 8.4–10.5)
Chloride: 103 mEq/L (ref 96–112)
Creatinine, Ser: 0.54 mg/dL (ref 0.40–1.20)
GFR: 108.56 mL/min (ref >60.00)
Glucose, Bld: 86 mg/dL (ref 70–99)
Potassium: 3.3 mEq/L — ABNORMAL LOW (ref 3.5–5.1)
Sodium: 139 mEq/L (ref 135–145)

## 2020-04-16 LAB — LIPID PANEL
Cholesterol: 241 mg/dL — ABNORMAL HIGH (ref 0–200)
HDL: 55.9 mg/dL (ref >39.00)
LDL Cholesterol: 153 mg/dL — ABNORMAL HIGH (ref 0–99)
NonHDL: 184.6
Total CHOL/HDL Ratio: 4
Triglycerides: 160 mg/dL — ABNORMAL HIGH (ref 0.0–149.0)
VLDL: 32 mg/dL (ref 0.0–40.0)

## 2020-04-16 MED ORDER — POTASSIUM CHLORIDE CRYS ER 20 MEQ PO TBCR: 20.0000 meq | EXTENDED_RELEASE_TABLET | Freq: Every day | ORAL | 1 refills | Status: DC

## 2020-04-16 MED ORDER — MONTELUKAST SODIUM 10 MG PO TABS: 10.0000 mg | ORAL_TABLET | Freq: Every day | ORAL | 3 refills | Status: DC

## 2020-04-16 NOTE — Patient Instructions (Addendum)
Go to lab for blood draw.  Start montelukast daily at bedtime Continue flonase, saline, zyrtec and humifier. If not improvement, call office for ENT referral.   Preventive Care 65-48 Years Old, Female Preventive care refers to visits with your health care provider and lifestyle choices that can promote health and wellness. This includes:  A yearly physical exam. This may also be called an annual well check.  Regular dental visits and eye exams.  Immunizations.  Screening for certain conditions.  Healthy lifestyle choices, such as eating a healthy diet, getting regular exercise, not using drugs or products that contain nicotine and tobacco, and limiting alcohol use. What can I expect for my preventive care visit? Physical exam Your health care provider will check your:  Height and weight. This may be used to calculate body mass index (BMI), which tells if you are at a healthy weight.  Heart rate and blood pressure.  Skin for abnormal spots. Counseling Your health care provider may ask you questions about your:  Alcohol, tobacco, and drug use.  Emotional well-being.  Home and relationship well-being.  Sexual activity.  Eating habits.  Work and work Statistician.  Method of birth control.  Menstrual cycle.  Pregnancy history. What immunizations do I need?  Influenza (flu) vaccine  This is recommended every year. Tetanus, diphtheria, and pertussis (Tdap) vaccine  You may need a Td booster every 10 years. Varicella (chickenpox) vaccine  You may need this if you have not been vaccinated. Zoster (shingles) vaccine  You may need this after age 48. Measles, mumps, and rubella (MMR) vaccine  You may need at least one dose of MMR if you were born in 1957 or later. You may also need a second dose. Pneumococcal conjugate (PCV13) vaccine  You may need this if you have certain conditions and were not previously vaccinated. Pneumococcal polysaccharide (PPSV23)  vaccine  You may need one or two doses if you smoke cigarettes or if you have certain conditions. Meningococcal conjugate (MenACWY) vaccine  You may need this if you have certain conditions. Hepatitis A vaccine  You may need this if you have certain conditions or if you travel or work in places where you may be exposed to hepatitis A. Hepatitis B vaccine  You may need this if you have certain conditions or if you travel or work in places where you may be exposed to hepatitis B. Haemophilus influenzae type b (Hib) vaccine  You may need this if you have certain conditions. Human papillomavirus (HPV) vaccine  If recommended by your health care provider, you may need three doses over 6 months. You may receive vaccines as individual doses or as more than one vaccine together in one shot (combination vaccines). Talk with your health care provider about the risks and benefits of combination vaccines. What tests do I need? Blood tests  Lipid and cholesterol levels. These may be checked every 5 years, or more frequently if you are over 20 years old.  Hepatitis C test.  Hepatitis B test. Screening  Lung cancer screening. You may have this screening every year starting at age 48 if you have a 30-pack-year history of smoking and currently smoke or have quit within the past 15 years.  Colorectal cancer screening. All adults should have this screening starting at age 48 and continuing until age 68. Your health care provider may recommend screening at age 63 if you are at increased risk. You will have tests every 1-10 years, depending on your results and the type  of screening test.  Diabetes screening. This is done by checking your blood sugar (glucose) after you have not eaten for a while (fasting). You may have this done every 1-3 years.  Mammogram. This may be done every 1-2 years. Talk with your health care provider about when you should start having regular mammograms. This may depend on  whether you have a family history of breast cancer.  BRCA-related cancer screening. This may be done if you have a family history of breast, ovarian, tubal, or peritoneal cancers.  Pelvic exam and Pap test. This may be done every 3 years starting at age 48. Starting at age 60, this may be done every 5 years if you have a Pap test in combination with an HPV test. Other tests  Sexually transmitted disease (STD) testing.  Bone density scan. This is done to screen for osteoporosis. You may have this scan if you are at high risk for osteoporosis. Follow these instructions at home: Eating and drinking  Eat a diet that includes fresh fruits and vegetables, whole grains, lean protein, and low-fat dairy.  Take vitamin and mineral supplements as recommended by your health care provider.  Do not drink alcohol if: ? Your health care provider tells you not to drink. ? You are pregnant, may be pregnant, or are planning to become pregnant.  If you drink alcohol: ? Limit how much you have to 0-1 drink a day. ? Be aware of how much alcohol is in your drink. In the [place] one drink equals one 12 oz bottle of beer (355 mL), one 5 oz glass of wine (148 mL), or one 1 oz glass of hard liquor (44 mL). Lifestyle  Take daily care of your teeth and gums.  Stay active. Exercise for at least 30 minutes on 5 or more days each week.  Do not use any products that contain nicotine or tobacco, such as cigarettes, e-cigarettes, and chewing tobacco. If you need help quitting, ask your health care provider.  If you are sexually active, practice safe sex. Use a condom or other form of birth control (contraception) in order to prevent pregnancy and STIs (sexually transmitted infections).  If told by your health care provider, take low-dose aspirin daily starting at age 20. What's next?  Visit your health care provider once a year for a well check visit.  Ask your health care provider how often you should have your  eyes and teeth checked.  Stay up to date on all vaccines. This information is not intended to replace advice given to you by your health care provider. Make sure you discuss any questions you have with your health care provider. Document Revised: 01/07/2018 Document Reviewed: 01/07/2018 Elsevier Patient Education  2020 [employer].

## 2020-04-16 NOTE — Progress Notes (Signed)
Subjective:    Patient ID: Sandra Huffman, female    DOB: 06/13/1970, 49 y.o.   MRN: [id number]  Patient presents today for CPE and eval of chronic conditions  HPI Chronic pansinusitis Minimal improvement with flonase, OTC antihistamine, and humidifier. Persistent sinus congestion and postnasal drip. Add montelukast. Refer to ENT if no improvement  HTN (hypertension) BP at goal with amlodipine BP Readings from Last 3 Encounters:  04/16/20 140/90  01/12/20 120/80  10/17/19 (!) 146/92   Maintain medication  Hypokalemia Chronic hypokalemia at 3.3 No diuretic use. Oral potassium supplement sent daily. Repeat BMP in 3-73months  Sexual History (orientation,birth control, marital status, STD):married, sexually active, denies need for STD screen, up to date with mammogram, need PAP today  Depression/Suicide: Depression screen [facility] 2/9 04/16/2020 10/17/2019 06/13/2019 09/09/2017 01/08/2015  Decreased Interest 0 0 0 0 0  Down, Depressed, Hopeless 0 0 0 0 0  PHQ - 2 Score 0 0 0 0 0   Vision:up to date  Dental:up to date  Immunizations: (TDAP, Hep C screen, Pneumovax, Influenza, zoster)  Health Maintenance  Topic Date Due  . Pap Smear  01/07/2018  . Tetanus Vaccine  06/12/2020*  . HIV Screening  06/12/2020*  . Flu Shot  08/09/2020*  .  Hepatitis C: One time screening is recommended by Center for Disease Control  (CDC) for  adults born from [birth year] through 1964.   04/16/2021*  . Mammogram  10/16/2020  . COVID-19 Vaccine  Completed  *Topic was postponed. The date shown is not the original due date.   Diet:regular.  Weight:  Wt Readings from Last 3 Encounters:  04/16/20 137 lb (62.1 kg)  01/12/20 137 lb 9.6 oz (62.4 kg)  10/17/19 137 lb 9.6 oz (62.4 kg)   Fall Risk: Fall Risk  04/16/2020 10/17/2019 06/13/2019 09/09/2017  Falls in the past year? 0 0 0 No  Number falls in past yr: 0 - - -  Injury with Fall? 0 - - -  Follow up - - Falls evaluation completed -   Medications and allergies  reviewed with patient and updated if appropriate.  Patient Active Problem List   Diagnosis Date Noted  . Mixed hyperlipidemia 04/16/2020  . Chronic pansinusitis 04/16/2020  . Seasonal allergic rhinitis 10/17/2019  . Hypokalemia 08/14/2011  . HTN (hypertension) 07/09/2011  . History of iron deficiency anemia     Current Outpatient Medications on File Prior to Visit  Medication Sig Dispense Refill  . amLODipine (NORVASC) 5 MG tablet Take 1qd 90 tablet 3  . cetirizine (ZYRTEC) 10 MG tablet Take 1 tablet (10 mg total) by mouth at bedtime. 90 tablet 1  . fluticasone (FLONASE) 50 MCG/ACT nasal spray SHAKE LIQUID AND USE 2 SPRAYS IN EACH NOSTRIL DAILY 16 g 0  . Multiple Vitamin (MULTIVITAMIN) tablet Take 1 tablet by mouth daily.    . sodium chloride (OCEAN) 0.65 % SOLN nasal spray Place 1 spray into both nostrils as needed for congestion. 15 mL 0  . [DISCONTINUED] hydrochlorothiazide (HYDRODIURIL) 12.5 MG tablet Take 1 tablet (12.5 mg total) by mouth daily. 30 tablet 0  . [DISCONTINUED] spironolactone (ALDACTONE) 25 MG tablet Take 1 tablet (25 mg total) by mouth daily. 30 tablet 0   No current facility-administered medications on file prior to visit.    Past Medical History:  Diagnosis Date  . History of chicken pox   . History of iron deficiency anemia   . HTN (hypertension)     No past surgical history on file.  Social History   Socioeconomic History  . Marital status: Married    Spouse name: Not on file  . Number of children: Not on file  . Years of education: Not on file  . Highest education level: Not on file  Occupational History  . Not on file  Tobacco Use  . Smoking status: Never Smoker  . Smokeless tobacco: Never Used  Vaping Use  . Vaping Use: Never used  Substance and Sexual Activity  . Alcohol use: No  . Drug use: No  . Sexual activity: Not on file  Other Topics Concern  . Not on file  Social History Narrative   Caffeine: none   Lives with husband and  daughter (2010), no pets, 50 yo grown daughter   Occupation: sewer at [employer]   Edu: [employer]   Activity: no regular exercise   Social Determinants of Corporate investment banker Strain:   . Difficulty of Paying Living Expenses: Not on file  Food Insecurity:   . Worried About Programme researcher, broadcasting/film/video in the Last Year: Not on file  . Ran Out of Food in the Last Year: Not on file  Transportation Needs:   . Lack of Transportation (Medical): Not on file  . Lack of Transportation (Non-Medical): Not on file  Physical Activity:   . Days of Exercise per Week: Not on file  . Minutes of Exercise per Session: Not on file  Stress:   . Feeling of Stress : Not on file  Social Connections:   . Frequency of Communication with Friends and Family: Not on file  . Frequency of Social Gatherings with Friends and Family: Not on file  . Attends Religious Services: Not on file  . Active Member of Clubs or Organizations: Not on file  . Attends Banker Meetings: Not on file  . Marital Status: Not on file    Family History  Problem Relation Age of Onset  . Cancer Mother 38       lung, chewing tobacco  . Hyperlipidemia Father   . Coronary artery disease Neg Hx   . Stroke Neg Hx   . Diabetes Neg Hx   . Breast cancer Neg Hx         Review of Systems  Constitutional: Negative for fever, malaise/fatigue and weight loss.  HENT: Negative for congestion and sore throat.   Eyes:       Negative for visual changes  Respiratory: Negative for cough and shortness of breath.   Cardiovascular: Negative for chest pain, palpitations and leg swelling.  Gastrointestinal: Negative for blood in stool, constipation, diarrhea and heartburn.  Genitourinary: Negative for dysuria, frequency and urgency.  Musculoskeletal: Negative for falls, joint pain and myalgias.  Skin: Negative for rash.  Neurological: Negative for dizziness, sensory change and headaches.  Endo/Heme/Allergies: Does not bruise/bleed  easily.  Psychiatric/Behavioral: Negative for depression, substance abuse and suicidal ideas. The patient is not nervous/anxious.    Objective:   Vitals:   04/16/20 0818  BP: 140/90  Pulse: 88  Temp: 98.1 F (36.7 C)  SpO2: 100%   Body mass index is 25.89 kg/m.   Physical Examination:  Physical Exam Vitals reviewed. Exam conducted with a chaperone present.  Constitutional:      General: She is not in acute distress.    Appearance: She is well-developed.  HENT:     Right Ear: External ear normal.     Left Ear: External ear normal.     Nose: Nose  normal.     Mouth/Throat:     Pharynx: No oropharyngeal exudate.  Eyes:     Conjunctiva/sclera: Conjunctivae normal.     Pupils: Pupils are equal, round, and reactive to light.  Cardiovascular:     Rate and Rhythm: Normal rate and regular rhythm.     Heart sounds: Normal heart sounds.  Pulmonary:     Effort: Pulmonary effort is normal. No respiratory distress.     Breath sounds: Normal breath sounds.  Chest:     Chest wall: No tenderness.     Comments: Declined breast exam Abdominal:     Hernia: There is no hernia in the left inguinal area or right inguinal area.  Genitourinary:    Labia:        Right: No rash, tenderness or lesion.        Left: No rash, tenderness or lesion.      Vagina: Normal.     Cervix: Normal.     Uterus: Normal.      Adnexa: Right adnexa normal and left adnexa normal.  Musculoskeletal:        General: Normal range of motion.  Lymphadenopathy:     Lower Body: No right inguinal adenopathy. No left inguinal adenopathy.  Neurological:     Mental Status: She is alert and oriented to person, place, and time.     Deep Tendon Reflexes: Reflexes are normal and symmetric.    ASSESSMENT and PLAN: This visit occurred during the SARS-CoV-2 public health emergency.  Safety protocols were in place, including screening questions prior to the visit, additional usage of staff PPE, and extensive cleaning of  exam room while observing appropriate contact time as indicated for disinfecting solutions.   Taylan was seen today for annual exam.  Diagnoses and all orders for this visit:  Encounter for preventative adult health care exam with abnormal findings -     Cytology - PAP( [place])  Primary hypertension -     Basic metabolic panel  Seasonal allergic rhinitis, unspecified trigger -     montelukast (SINGULAIR) 10 MG tablet; Take 1 tablet (10 mg total) by mouth at bedtime.  Hypokalemia -     Basic metabolic panel -     potassium chloride SA (KLOR-CON) 20 MEQ tablet; Take 1 tablet (20 mEq total) by mouth daily.  Encounter for Papanicolaou smear for cervical cancer screening -     Cytology - PAP( [place])  Chronic pansinusitis -     montelukast (SINGULAIR) 10 MG tablet; Take 1 tablet (10 mg total) by mouth at bedtime.  Mixed hyperlipidemia -     Lipid panel  History of iron deficiency anemia -     Iron, TIBC and Ferritin Panel      Problem List Items Addressed This Visit      Cardiovascular and Mediastinum   HTN (hypertension)    BP at goal with amlodipine BP Readings from Last 3 Encounters:  04/16/20 140/90  01/12/20 120/80  10/17/19 (!) 146/92   Maintain medication      Relevant Orders   Basic metabolic panel (Completed)     Respiratory   Chronic pansinusitis    Minimal improvement with flonase, OTC antihistamine, and humidifier. Persistent sinus congestion and postnasal drip. Add montelukast. Refer to ENT if no improvement      Relevant Medications   montelukast (SINGULAIR) 10 MG tablet   Seasonal allergic rhinitis   Relevant Medications   montelukast (SINGULAIR) 10 MG tablet     Other  History of iron deficiency anemia   Relevant Orders   Iron, TIBC and Ferritin Panel   Hypokalemia    Chronic hypokalemia at 3.3 No diuretic use. Oral potassium supplement sent daily. Repeat BMP in 3-67months      Relevant Medications   potassium  chloride SA (KLOR-CON) 20 MEQ tablet   Other Relevant Orders   Basic metabolic panel (Completed)   Mixed hyperlipidemia   Relevant Orders   Lipid panel (Completed)    Other Visit Diagnoses    Encounter for preventative adult health care exam with abnormal findings    -  Primary   Relevant Orders   Cytology - PAP( [place])   Encounter for Papanicolaou smear for cervical cancer screening       Relevant Orders   Cytology - PAP( [place])      Follow up: Return in about 6 months (around 10/15/2020) for HTN and , hyperlipidemia (fasting).  [name], NP

## 2020-04-16 NOTE — Assessment & Plan Note (Signed)
Minimal improvement with flonase, OTC antihistamine, and humidifier. Persistent sinus congestion and postnasal drip. Add montelukast. Refer to ENT if no improvement

## 2020-04-16 NOTE — Assessment & Plan Note (Addendum)
Chronic hypokalemia at 3.3 No diuretic use. No diarrhea Oral potassium supplement sent daily. Repeat BMP in 3-64months

## 2020-04-16 NOTE — Assessment & Plan Note (Signed)
BP at goal with amlodipine BP Readings from Last 3 Encounters:  04/16/20 140/90  01/12/20 120/80  10/17/19 (!) 146/92   Maintain medication

## 2020-04-17 LAB — IRON,TIBC AND FERRITIN PANEL
%SAT: 14 % — ABNORMAL LOW (ref 16–45)
Ferritin: 15 ng/mL — ABNORMAL LOW (ref 16–232)
Iron: 50 ug/dL (ref 40–190)
TIBC: 365 ug/dL (ref 250–450)

## 2020-04-19 LAB — CYTOLOGY - PAP
Comment: NEGATIVE
Diagnosis: UNDETERMINED — AB
High risk HPV: NEGATIVE

## 2020-05-01 ENCOUNTER — Other Ambulatory Visit: Payer: Self-pay | Admitting: Nurse Practitioner

## 2020-05-01 DIAGNOSIS — J302 Other seasonal allergic rhinitis: Secondary | ICD-10-CM

## 2020-05-16 ENCOUNTER — Other Ambulatory Visit: Payer: Self-pay | Admitting: Nurse Practitioner

## 2020-05-16 DIAGNOSIS — J302 Other seasonal allergic rhinitis: Secondary | ICD-10-CM

## 2020-11-05 ENCOUNTER — Ambulatory Visit (INDEPENDENT_AMBULATORY_CARE_PROVIDER_SITE_OTHER): Payer: Commercial Managed Care - PPO

## 2020-11-05 ENCOUNTER — Other Ambulatory Visit: Payer: Self-pay

## 2020-11-05 ENCOUNTER — Ambulatory Visit
Admission: EM | Admit: 2020-11-05 | Discharge: 2020-11-05 | Disposition: A | Discharge status: Home / Self Care | Payer: Commercial Managed Care - PPO | Attending: Emergency Medicine | Admitting: Emergency Medicine

## 2020-11-05 ENCOUNTER — Encounter: Payer: Self-pay | Admitting: Emergency Medicine

## 2020-11-05 DIAGNOSIS — R059 Cough, unspecified: Secondary | ICD-10-CM | POA: Diagnosis not present

## 2020-11-05 MED ORDER — FAMOTIDINE 20 MG PO TABS: 20.0000 mg | ORAL_TABLET | Freq: Two times a day (BID) | ORAL | 0 refills | Status: DC

## 2020-11-05 MED ORDER — BENZONATATE 200 MG PO CAPS
200.0000 mg | ORAL_CAPSULE | Freq: Three times a day (TID) | ORAL | 0 refills | Status: DC | PRN
Start: 2020-11-05 — End: 2021-02-28

## 2020-11-05 MED ORDER — FLUTICASONE PROPIONATE 50 MCG/ACT NA SUSP: 2.0000 | Freq: Every day | NASAL | 0 refills | Status: DC

## 2020-11-05 NOTE — ED Triage Notes (Signed)
Patient c/o non-productive cough x 2 weeks.  Patient does not feel bad, just a cough.  Patient states that she had a cough like this in the past and it was her BP meds.  Home COVID test was negative.  Patient is vaccinated for COVID.

## 2020-11-05 NOTE — ED Provider Notes (Signed)
HPI  SUBJECTIVE:  Sandra Huffman is a 50 y.o. female who presents with nonproductive cough for the past 2 weeks.  She reports chest soreness secondary to the cough.  She had a negative home COVID test last night.  No fevers, nasal congestion, rhinorrhea, postnasal drip, sore throat, wheezing, shortness of breath, dyspnea on exertion, allergy or GERD symptoms.  She is taking allergy medications which is managing her allergies adequately.  No body aches, headaches, loss of sense of smell or taste, nausea, vomiting, diarrhea, abdominal pain.  No night sweats, unintentional weight loss, recent travel.  No known COVID exposure.  She got the COVID booster.  She states that she had a similar cough with her blood pressure medication but does not remember what it was.  She is not using Flonase.  She has a past medical history of allergies, hypertension.  No history of pulmonary disease, smoking, GERD.  DJM:EQAS, Bonna Gains, NP   Past Medical History:  Diagnosis Date   History of chicken pox    History of iron deficiency anemia    HTN (hypertension)     History reviewed. No pertinent surgical history.  Family History  Problem Relation Age of Onset   Cancer Mother 47       lung, chewing tobacco   Hyperlipidemia Father    Coronary artery disease Neg Hx    Stroke Neg Hx    Diabetes Neg Hx    Breast cancer Neg Hx     Social History   Tobacco Use   Smoking status: Never   Smokeless tobacco: Never  Vaping Use   Vaping Use: Never used  Substance Use Topics   Alcohol use: No   Drug use: No    No current facility-administered medications for this encounter.  Current Outpatient Medications:    amLODipine (NORVASC) 5 MG tablet, Take 1qd, Disp: 90 tablet, Rfl: 3   benzonatate (TESSALON) 200 MG capsule, Take 1 capsule (200 mg total) by mouth 3 (three) times daily as needed for cough., Disp: 30 capsule, Rfl: 0   cetirizine (ZYRTEC) 10 MG tablet, TAKE 1 TABLET(10 MG) BY MOUTH AT BEDTIME, Disp: 90  tablet, Rfl: 1   famotidine (PEPCID) 20 MG tablet, Take 1 tablet (20 mg total) by mouth 2 (two) times daily., Disp: 40 tablet, Rfl: 0   fluticasone (FLONASE) 50 MCG/ACT nasal spray, Place 2 sprays into both nostrils daily., Disp: 16 g, Rfl: 0   Multiple Vitamin (MULTIVITAMIN) tablet, Take 1 tablet by mouth daily., Disp: , Rfl:    montelukast (SINGULAIR) 10 MG tablet, Take 1 tablet (10 mg total) by mouth at bedtime., Disp: 30 tablet, Rfl: 3   potassium chloride SA (KLOR-CON) 20 MEQ tablet, Take 1 tablet (20 mEq total) by mouth daily., Disp: 90 tablet, Rfl: 1   sodium chloride (OCEAN) 0.65 % SOLN nasal spray, Place 1 spray into both nostrils as needed for congestion., Disp: 15 mL, Rfl: 0  Allergies  Allergen Reactions   Iron Anaphylaxis   Ace Inhibitors     cough     ROS  As noted in HPI.   Physical Exam  BP (!) 145/97 (BP Location: Left Arm)   Pulse 93   Temp 98.3 F (36.8 C) (Oral)   LMP 08/27/2020   SpO2 96%   Constitutional: Well developed, well nourished, no acute distress Eyes:  EOMI, conjunctiva normal bilaterally HENT: Normocephalic, atraumatic,mucus membranes moist.  Erythematous, swollen turbinates.  Mild nasal congestion.  No maxillary, frontal sinus tenderness.  Positive cobblestoning.  No postnasal drip. Respiratory: Normal inspiratory effort, lungs clear bilaterally.  Positive mild anterior and lateral chest wall tenderness Cardiovascular: Normal rate, regular rhythm, no murmurs rubs or gallops GI: nondistended skin: No rash, skin intact Musculoskeletal: no deformities Neurologic: Alert & oriented x 3, no focal neuro deficits Psychiatric: Speech and behavior appropriate   ED Course   Medications - No data to display  Orders Placed This Encounter  Procedures   DG Chest 2 View    Standing Status:   Standing    Number of Occurrences:   1    Order Specific Question:   Reason for Exam (SYMPTOM  OR DIAGNOSIS REQUIRED)    Answer:   cough x 2 weeks.  Rule out  pneumonia, pleural effusion, pulmonary edema    No results found for this or any previous visit (from the past 24 hour(s)). DG Chest 2 View  Result Date: 11/05/2020 CLINICAL DATA:  Cough for 2 weeks EXAM: CHEST - 2 VIEW COMPARISON:  January 25, 2018 FINDINGS: There is an apparent nipple shadow on each side. No edema or airspace opacity. Heart size and pulmonary vascularity are normal. No adenopathy. No bone lesions. IMPRESSION: No edema or airspace opacity. Heart size normal. Apparent nipple shadow on each side noted. Electronically Signed   By: Bretta Bang III M.D.   On: 11/05/2020 13:21    ED Clinical Impression  1. Cough      ED Assessment/Plan  Patient with cough for 2 weeks-suspect postnasal drip or silent GERD causing her symptoms.  We will check an x-ray to rule out pneumonia, effusion, pulmonary edema although I think these are less likely.  Plan sent home with Flonase, saline nasal irrigation, Tessalon and Pepcid.  She is to continue her allergy medication.  Follow-up with her doctor in 1 week if not better, ER return precautions given.  Reviewed imaging independently. Normal. See radiology report for full details.  Chest x-ray normal.  Plan as above.  Discussed  imaging, MDM, treatment plan, and plan for follow-up with patient. Discussed sn/sx that should prompt return to the ED. patient agrees with plan.   Meds ordered this encounter  Medications   fluticasone (FLONASE) 50 MCG/ACT nasal spray    Sig: Place 2 sprays into both nostrils daily.    Dispense:  16 g    Refill:  0   benzonatate (TESSALON) 200 MG capsule    Sig: Take 1 capsule (200 mg total) by mouth 3 (three) times daily as needed for cough.    Dispense:  30 capsule    Refill:  0   famotidine (PEPCID) 20 MG tablet    Sig: Take 1 tablet (20 mg total) by mouth 2 (two) times daily.    Dispense:  40 tablet    Refill:  0       *This clinic note was created using Scientist, clinical (histocompatibility and immunogenetics). Therefore,  there may be occasional mistakes despite careful proofreading.  ?    Domenick Gong, MD 11/05/20 1343

## 2020-11-05 NOTE — Discharge Instructions (Addendum)
Your chest x-ray was negative for pneumonia or any other concerning findings.  Flonase, saline nasal irrigation with a [employer]Med sinus rinse and distilled water as often as you want, Tessalon and Pepcid.  Continue your allergy medication.

## 2020-11-22 ENCOUNTER — Other Ambulatory Visit: Payer: Self-pay | Admitting: Nurse Practitioner

## 2020-11-22 DIAGNOSIS — J302 Other seasonal allergic rhinitis: Secondary | ICD-10-CM

## 2020-11-22 NOTE — Telephone Encounter (Signed)
Chart supports Rx  Last seen 04/16/20 No future appointments scheduled

## 2020-11-23 ENCOUNTER — Other Ambulatory Visit: Payer: Self-pay | Admitting: Obstetrics

## 2020-11-23 ENCOUNTER — Other Ambulatory Visit: Payer: Self-pay | Admitting: Nurse Practitioner

## 2020-11-23 DIAGNOSIS — Z1231 Encounter for screening mammogram for malignant neoplasm of breast: Secondary | ICD-10-CM

## 2020-11-26 ENCOUNTER — Ambulatory Visit
Admission: RE | Admit: 2020-11-26 | Discharge: 2020-11-26 | Disposition: A | Discharge status: Home / Self Care | Payer: Commercial Managed Care - PPO | Source: Ambulatory Visit | Attending: Nurse Practitioner | Admitting: Nurse Practitioner

## 2020-11-26 ENCOUNTER — Other Ambulatory Visit: Payer: Self-pay

## 2020-11-26 DIAGNOSIS — I10 Essential (primary) hypertension: Secondary | ICD-10-CM

## 2020-11-26 DIAGNOSIS — Z1231 Encounter for screening mammogram for malignant neoplasm of breast: Secondary | ICD-10-CM

## 2020-11-29 ENCOUNTER — Other Ambulatory Visit: Payer: Self-pay

## 2020-11-29 DIAGNOSIS — I10 Essential (primary) hypertension: Secondary | ICD-10-CM

## 2020-11-29 MED ORDER — AMLODIPINE BESYLATE 5 MG PO TABS: ORAL_TABLET | ORAL | 0 refills | Status: DC

## 2021-02-28 ENCOUNTER — Ambulatory Visit (INDEPENDENT_AMBULATORY_CARE_PROVIDER_SITE_OTHER): Payer: Commercial Managed Care - PPO | Admitting: Nurse Practitioner

## 2021-02-28 ENCOUNTER — Encounter: Payer: Self-pay | Admitting: Nurse Practitioner

## 2021-02-28 ENCOUNTER — Other Ambulatory Visit: Payer: Self-pay

## 2021-02-28 VITALS — BP 124/84 | HR 80 | Temp 97.0°F | Wt 140.2 lb

## 2021-02-28 DIAGNOSIS — J302 Other seasonal allergic rhinitis: Secondary | ICD-10-CM | POA: Diagnosis not present

## 2021-02-28 DIAGNOSIS — I10 Essential (primary) hypertension: Secondary | ICD-10-CM | POA: Diagnosis not present

## 2021-02-28 DIAGNOSIS — J324 Chronic pansinusitis: Secondary | ICD-10-CM

## 2021-02-28 MED ORDER — AZITHROMYCIN 250 MG PO TABS: 250.0000 mg | ORAL_TABLET | Freq: Every day | ORAL | 0 refills | Status: DC

## 2021-02-28 MED ORDER — AMLODIPINE BESYLATE 5 MG PO TABS: ORAL_TABLET | ORAL | 3 refills | Status: DC

## 2021-02-28 MED ORDER — PREDNISONE 10 MG (21) PO TBPK: ORAL_TABLET | ORAL | 0 refills | Status: DC

## 2021-02-28 NOTE — Patient Instructions (Addendum)
Do not take any lisinopril. It causes coughing  Continue zyrtec Stop flonase while taking oral prednisone. Continue saline sinus rinse as needed.  You will be contacted to schedule appt with ENT.

## 2021-02-28 NOTE — Assessment & Plan Note (Signed)
BP at goal with amlodipine BP Readings from Last 3 Encounters:  02/28/21 124/84  11/05/20 (!) 145/97  04/16/20 140/90

## 2021-02-28 NOTE — Progress Notes (Signed)
Subjective:  Patient ID: Sandra Huffman, female    DOB: 03-Mar-1971  Age: 50 y.o. MRN: [id number]  CC: Acute Visit (Pt states she is still having nasal congestion, states it feels like something is stuck in her nose all the time. Pt states this has been a ongoing issues for the past 2 years and was informed if it does not improve when she is ready she should come in for a referral to the ENT office. )  HPI  Chronic pansinusitis No improvement with flonase, montelukast, zyrtec and saline sinus rinse. Reports persistent sinus congestion, pressure, teeth sensitivity, intermittent dizziness, and post nasal drainage. Denies any fever ot loss of smell/taste or bad breathe. Declined referral to allergist Agreed to ENT referral only. Order entered Due to very swollen sinus mucosa and erythema: also prescribed oral prednisone and azithromycin.  HTN (hypertension) BP at goal with amlodipine BP Readings from Last 3 Encounters:  02/28/21 124/84  11/05/20 (!) 145/97  04/16/20 140/90    Reviewed past Medical, Social and Family history today.  Outpatient Medications Prior to Visit  Medication Sig Dispense Refill   cetirizine (ZYRTEC) 10 MG tablet TAKE 1 TABLET(10 MG) BY MOUTH AT BEDTIME 90 tablet 1   fluticasone (FLONASE) 50 MCG/ACT nasal spray Place 2 sprays into both nostrils daily. 16 g 0   Multiple Vitamin (MULTIVITAMIN) tablet Take 1 tablet by mouth daily.     amLODipine (NORVASC) 5 MG tablet Take 1qd 90 tablet 0   benzonatate (TESSALON) 200 MG capsule Take 1 capsule (200 mg total) by mouth 3 (three) times daily as needed for cough. (Patient not taking: Reported on 02/28/2021) 30 capsule 0   famotidine (PEPCID) 20 MG tablet Take 1 tablet (20 mg total) by mouth 2 (two) times daily. (Patient not taking: Reported on 02/28/2021) 40 tablet 0   montelukast (SINGULAIR) 10 MG tablet Take 1 tablet (10 mg total) by mouth at bedtime. (Patient not taking: Reported on 02/28/2021) 30 tablet 3   potassium chloride  SA (KLOR-CON) 20 MEQ tablet Take 1 tablet (20 mEq total) by mouth daily. (Patient not taking: Reported on 02/28/2021) 90 tablet 1   sodium chloride (OCEAN) 0.65 % SOLN nasal spray Place 1 spray into both nostrils as needed for congestion. (Patient not taking: Reported on 02/28/2021) 15 mL 0   No facility-administered medications prior to visit.    ROS See HPI  Objective:  BP 124/84 (BP Location: Left Arm, Patient Position: Sitting, Cuff Size: Normal)   Pulse 80   Temp (!) 97 F (36.1 C) (Temporal)   Wt 140 lb 3.2 oz (63.6 kg)   SpO2 99%   BMI 26.49 kg/m   Physical Exam Vitals reviewed.  HENT:     Right Ear: Tympanic membrane, ear canal and external ear normal.     Left Ear: Tympanic membrane, ear canal and external ear normal.     Nose: Nasal tenderness, mucosal edema, congestion and rhinorrhea present.     Right Nostril: Occlusion present.     Left Nostril: Occlusion present.     Right Turbinates: Enlarged and swollen.     Left Turbinates: Enlarged and swollen.     Right Sinus: Maxillary sinus tenderness and frontal sinus tenderness present.     Left Sinus: Maxillary sinus tenderness and frontal sinus tenderness present.     Mouth/Throat:     Pharynx: Posterior oropharyngeal erythema present. No oropharyngeal exudate or uvula swelling.     Tonsils: 1+ on the right. 1+ on the left.  Cardiovascular:     Rate and Rhythm: Normal rate.     Pulses: Normal pulses.  Pulmonary:     Effort: Pulmonary effort is normal.  Neurological:     Mental Status: She is alert.    Assessment & Plan:  This visit occurred during the SARS-CoV-2 public health emergency.  Safety protocols were in place, including screening questions prior to the visit, additional usage of staff PPE, and extensive cleaning of exam room while observing appropriate contact time as indicated for disinfecting solutions.   Special was seen today for acute visit.  Diagnoses and all orders for this visit:  Chronic  pansinusitis -     Ambulatory referral to ENT -     predniSONE (STERAPRED UNI-PAK 21 TAB) 10 MG (21) TBPK tablet; As directed on package -     azithromycin (ZITHROMAX Z-PAK) 250 MG tablet; Take 1 tablet (250 mg total) by mouth daily. Take 2tabs on first day, then 1tab once a day till complete  Seasonal allergic rhinitis, unspecified trigger -     Ambulatory referral to ENT -     predniSONE (STERAPRED UNI-PAK 21 TAB) 10 MG (21) TBPK tablet; As directed on package  Essential hypertension -     amLODipine (NORVASC) 5 MG tablet; Take 1qd  Primary hypertension  Problem List Items Addressed This Visit       Cardiovascular and Mediastinum   HTN (hypertension)    BP at goal with amlodipine BP Readings from Last 3 Encounters:  02/28/21 124/84  11/05/20 (!) 145/97  04/16/20 140/90        Relevant Medications   amLODipine (NORVASC) 5 MG tablet     Respiratory   Chronic pansinusitis - Primary    No improvement with flonase, montelukast, zyrtec and saline sinus rinse. Reports persistent sinus congestion, pressure, teeth sensitivity, intermittent dizziness, and post nasal drainage. Denies any fever ot loss of smell/taste or bad breathe. Declined referral to allergist Agreed to ENT referral only. Order entered Due to very swollen sinus mucosa and erythema: also prescribed oral prednisone and azithromycin.      Relevant Medications   predniSONE (STERAPRED UNI-PAK 21 TAB) 10 MG (21) TBPK tablet   azithromycin (ZITHROMAX Z-PAK) 250 MG tablet   Other Relevant Orders   Ambulatory referral to ENT   Seasonal allergic rhinitis   Relevant Medications   predniSONE (STERAPRED UNI-PAK 21 TAB) 10 MG (21) TBPK tablet   Other Relevant Orders   Ambulatory referral to ENT   Other Visit Diagnoses     Essential hypertension       Relevant Medications   amLODipine (NORVASC) 5 MG tablet       Follow-up: Return in about 2 months (around 04/30/2021) for CPE (fasting).  [name], NP

## 2021-02-28 NOTE — Assessment & Plan Note (Signed)
No improvement with flonase, montelukast, zyrtec and saline sinus rinse. Reports persistent sinus congestion, pressure, teeth sensitivity, intermittent dizziness, and post nasal drainage. Denies any fever ot loss of smell/taste or bad breathe. Declined referral to allergist Agreed to ENT referral only. Order entered Due to very swollen sinus mucosa and erythema: also prescribed oral prednisone and azithromycin.

## 2021-05-27 ENCOUNTER — Other Ambulatory Visit: Payer: Self-pay | Admitting: Nurse Practitioner

## 2021-05-27 DIAGNOSIS — J302 Other seasonal allergic rhinitis: Secondary | ICD-10-CM

## 2021-05-29 ENCOUNTER — Other Ambulatory Visit: Payer: Self-pay | Admitting: Nurse Practitioner

## 2021-05-29 DIAGNOSIS — I10 Essential (primary) hypertension: Secondary | ICD-10-CM

## 2021-06-03 NOTE — Telephone Encounter (Signed)
Chart supports Rx Last seen 02/2021 No future appointments scheduled.  LVM informing patient to call and schedule f/u appointment.

## 2021-09-05 ENCOUNTER — Encounter: Payer: Self-pay | Admitting: Nurse Practitioner

## 2021-09-05 ENCOUNTER — Ambulatory Visit (INDEPENDENT_AMBULATORY_CARE_PROVIDER_SITE_OTHER): Payer: BC Managed Care – PPO | Admitting: Nurse Practitioner

## 2021-09-05 ENCOUNTER — Other Ambulatory Visit (HOSPITAL_COMMUNITY)
Admission: RE | Admit: 2021-09-05 | Discharge: 2021-09-05 | Disposition: A | Discharge status: Home / Self Care | Payer: BC Managed Care – PPO | Source: Ambulatory Visit | Attending: Nurse Practitioner | Admitting: Nurse Practitioner

## 2021-09-05 ENCOUNTER — Ambulatory Visit (INDEPENDENT_AMBULATORY_CARE_PROVIDER_SITE_OTHER): Payer: BC Managed Care – PPO

## 2021-09-05 VITALS — BP 110/64 | HR 80 | Temp 97.9°F | Ht 61.0 in | Wt 134.8 lb

## 2021-09-05 DIAGNOSIS — Z23 Encounter for immunization: Secondary | ICD-10-CM | POA: Diagnosis not present

## 2021-09-05 DIAGNOSIS — J324 Chronic pansinusitis: Secondary | ICD-10-CM | POA: Diagnosis not present

## 2021-09-05 DIAGNOSIS — E782 Mixed hyperlipidemia: Secondary | ICD-10-CM

## 2021-09-05 DIAGNOSIS — Z0001 Encounter for general adult medical examination with abnormal findings: Secondary | ICD-10-CM

## 2021-09-05 DIAGNOSIS — I10 Essential (primary) hypertension: Secondary | ICD-10-CM | POA: Diagnosis not present

## 2021-09-05 DIAGNOSIS — J329 Chronic sinusitis, unspecified: Secondary | ICD-10-CM | POA: Diagnosis not present

## 2021-09-05 DIAGNOSIS — R87610 Atypical squamous cells of undetermined significance on cytologic smear of cervix (ASC-US): Secondary | ICD-10-CM | POA: Diagnosis not present

## 2021-09-05 DIAGNOSIS — D500 Iron deficiency anemia secondary to blood loss (chronic): Secondary | ICD-10-CM

## 2021-09-05 DIAGNOSIS — J302 Other seasonal allergic rhinitis: Secondary | ICD-10-CM

## 2021-09-05 DIAGNOSIS — Z1211 Encounter for screening for malignant neoplasm of colon: Secondary | ICD-10-CM | POA: Diagnosis not present

## 2021-09-05 LAB — CBC WITH DIFFERENTIAL/PLATELET
Basophils Absolute: 0 10*3/uL (ref 0.0–0.1)
Basophils Relative: 0.5 % (ref 0.0–3.0)
Eosinophils Absolute: 0.6 10*3/uL (ref 0.0–0.7)
Eosinophils Relative: 8.1 % — ABNORMAL HIGH (ref 0.0–5.0)
HCT: 41.7 % (ref 36.0–46.0)
Hemoglobin: 14.1 g/dL (ref 12.0–15.0)
Lymphocytes Relative: 24.9 % (ref 12.0–46.0)
Lymphs Abs: 1.9 10*3/uL (ref 0.7–4.0)
MCHC: 33.7 g/dL (ref 30.0–36.0)
MCV: 86.3 fl (ref 78.0–100.0)
Monocytes Absolute: 0.5 10*3/uL (ref 0.1–1.0)
Monocytes Relative: 6.7 % (ref 3.0–12.0)
Neutro Abs: 4.7 10*3/uL (ref 1.4–7.7)
Neutrophils Relative %: 59.8 % (ref 43.0–77.0)
Platelets: 228 10*3/uL (ref 150.0–400.0)
RBC: 4.83 Mil/uL (ref 3.87–5.11)
RDW: 12.5 % (ref 11.5–15.5)
WBC: 7.8 10*3/uL (ref 4.0–10.5)

## 2021-09-05 LAB — COMPREHENSIVE METABOLIC PANEL
ALT: 43 U/L — ABNORMAL HIGH (ref 0–35)
AST: 39 U/L — ABNORMAL HIGH (ref 0–37)
Albumin: 4.2 g/dL (ref 3.5–5.2)
Alkaline Phosphatase: 81 U/L (ref 39–117)
BUN: 14 mg/dL (ref 6–23)
CO2: 29 mEq/L (ref 19–32)
Calcium: 9.4 mg/dL (ref 8.4–10.5)
Chloride: 101 mEq/L (ref 96–112)
Creatinine, Ser: 0.65 mg/dL (ref 0.40–1.20)
GFR: 102.81 mL/min (ref >60.00)
Glucose, Bld: 101 mg/dL — ABNORMAL HIGH (ref 70–99)
Potassium: 3.7 mEq/L (ref 3.5–5.1)
Sodium: 139 mEq/L (ref 135–145)
Total Bilirubin: 0.4 mg/dL (ref 0.2–1.2)
Total Protein: 8 g/dL (ref 6.0–8.3)

## 2021-09-05 LAB — LIPID PANEL
Cholesterol: 258 mg/dL — ABNORMAL HIGH (ref 0–200)
HDL: 48.9 mg/dL (ref >39.00)
NonHDL: 209.29
Total CHOL/HDL Ratio: 5
Triglycerides: 288 mg/dL — ABNORMAL HIGH (ref 0.0–149.0)
VLDL: 57.6 mg/dL — ABNORMAL HIGH (ref 0.0–40.0)

## 2021-09-05 LAB — LDL CHOLESTEROL, DIRECT: Direct LDL: 163 mg/dL

## 2021-09-05 MED ORDER — AZELASTINE-FLUTICASONE 137-50 MCG/ACT NA SUSP: 1.0000 | Freq: Two times a day (BID) | NASAL | 5 refills | Status: AC

## 2021-09-05 MED ORDER — CETIRIZINE HCL 10 MG PO TABS: ORAL_TABLET | ORAL | 3 refills | Status: DC

## 2021-09-05 NOTE — Assessment & Plan Note (Signed)
No melena or hematochezia ?No menstrual cycle x 74months. ?No previous colonoscopy. ? ?Repeat cbc and iron panel ?Entered GI referral for colonoscopy. ?

## 2021-09-05 NOTE — Addendum Note (Signed)
Addended by: Elyn Peers on: 09/05/2021 02:02 PM ? ? Modules accepted: Orders ? ?

## 2021-09-05 NOTE — Progress Notes (Signed)
? ?Complete physical exam ? ?Patient: Sandra Huffman   DOB: 1971-03-25   51 y.o. Female  MRN: [id number] ?Visit Date: 09/05/2021 ? ?Subjective:  ?  ?Chief Complaint  ?Patient presents with  ? Annual Exam  ?  Physical-Breast and pap exam needed.  ?Pt is not fasting.   ? ?Sandra Huffman is a 51 y.o. female who presents today for a complete physical exam. She reports consuming a general diet.  Walking daily  She generally feels well. She reports sleeping well. She does have additional problems to discuss today.  ?Vision:No ?Dental:Yes ?STD Screen:No ? ?Most recent fall risk assessment: ? ?  02/28/2021  ?  1:34 PM  ?Fall Risk   ?Falls in the past year? 0  ?Number falls in past yr: 0  ?Injury with Fall? 0  ?Risk for fall due to : No Fall Risks  ?Follow up Falls evaluation completed  ? ?Most recent depression screenings: ? ?  09/05/2021  ?  1:02 PM 04/16/2020  ?  8:44 AM  ?PHQ 2/9 Scores  ?PHQ - 2 Score 0 0  ?PHQ- 9 Score 1   ? ?HPI  ?HTN (hypertension) ?BP at goal with amlodipine ?BP Readings from Last 3 Encounters:  ?09/05/21 110/64  ?02/28/21 124/84  ?11/05/20 (!) 145/97  ? ?Maintain med dose ? ?Chronic pansinusitis ?Unable to schedule appt with ENT. Do not get call. ?Reports improved sinus congestion with oral prednisone and azithromycin for several months. ?recurrent nasal congestion and pressure for several weeks despite use of zyrtec daily and use of flonase prn. ? ?Check x-ray of sinus ?Change flonase to dymista. Advised to use daily ?Maintain daily zyrtec ? ?Mixed hyperlipidemia ?Repeat lipid panel ?Advised to maintain heart healthy diet and regular execise ? ?Anemia ?No melena or hematochezia ?No menstrual cycle x 51months. ?No previous colonoscopy. ? ?Repeat cbc and iron panel ?Entered GI referral for colonoscopy. ? ? ?Past Medical History:  ?Diagnosis Date  ? History of chicken pox   ? History of iron deficiency anemia   ? HTN (hypertension)   ? ?History reviewed. No pertinent surgical history. ?Social History  ? ?Socioeconomic  History  ? Marital status: Married  ?  Spouse name: Not on file  ? Number of children: Not on file  ? Years of education: Not on file  ? Highest education level: Not on file  ?Occupational History  ? Not on file  ?Tobacco Use  ? Smoking status: Never  ? Smokeless tobacco: Never  ?Vaping Use  ? Vaping Use: Never used  ?Substance and Sexual Activity  ? Alcohol use: No  ? Drug use: No  ? Sexual activity: Yes  ?  Birth control/protection: Post-menopausal  ?Other Topics Concern  ? Not on file  ?Social History Narrative  ? Caffeine: none  ? Lives with husband and daughter (2010), no pets, 47 yo grown daughter  ? Occupation: sewer at [employer]  ? Edu: HS  ? Activity: no regular exercise  ? ?Social Determinants of Health  ? ?Financial Resource Strain: Not on file  ?Food Insecurity: Not on file  ?Transportation Needs: Not on file  ?Physical Activity: Not on file  ?Stress: Not on file  ?Social Connections: Not on file  ?Intimate Partner Violence: Not on file  ? ?Family Status  ?Relation Name Status  ? Mother  (Not Specified)  ? Father  Alive  ? Sister  Alive  ? Neg Hx  (Not Specified)  ? ?Family History  ?Problem Relation Age of Onset  ?  Cancer Mother 42  ?     lung, chewing tobacco  ? Hypertension Father   ? Diabetes Sister   ? Coronary artery disease Neg Hx   ? Stroke Neg Hx   ? Breast cancer Neg Hx   ? ?Allergies  ?Allergen Reactions  ? Iron Anaphylaxis  ? Ace Inhibitors   ?  cough  ?  ?Patient Care Team: ?Karielle Davidow, Bonna Gains, NP as PCP - General (Internal Medicine)  ? ?Medications: ?Outpatient Medications Prior to Visit  ?Medication Sig  ? amLODipine (NORVASC) 5 MG tablet TAKE 1 TABLET DAILY  ? Multiple Vitamin (MULTIVITAMIN) tablet Take 1 tablet by mouth daily.  ? [DISCONTINUED] cetirizine (ZYRTEC) 10 MG tablet TAKE 1 TABLET(10 MG) BY MOUTH AT BEDTIME  ? [DISCONTINUED] fluticasone (FLONASE) 50 MCG/ACT nasal spray Place 2 sprays into both nostrils daily.  ? [DISCONTINUED] azithromycin (ZITHROMAX Z-PAK) 250 MG tablet  Take 1 tablet (250 mg total) by mouth daily. Take 2tabs on first day, then 1tab once a day till complete (Patient not taking: Reported on 09/05/2021)  ? [DISCONTINUED] predniSONE (STERAPRED UNI-PAK 21 TAB) 10 MG (21) TBPK tablet As directed on package (Patient not taking: Reported on 09/05/2021)  ? ?No facility-administered medications prior to visit.  ? ?Wt Readings from Last 3 Encounters:  ?09/05/21 134 lb 12.8 oz (61.1 kg)  ?02/28/21 140 lb 3.2 oz (63.6 kg)  ?04/16/20 137 lb (62.1 kg)  ?  ?Review of Systems  ?Constitutional:  Negative for fever.  ?HENT:  Negative for congestion and sore throat.   ?Eyes:   ?     Negative for visual changes  ?Respiratory:  Negative for cough and shortness of breath.   ?Cardiovascular:  Negative for chest pain, palpitations and leg swelling.  ?Gastrointestinal:  Negative for blood in stool, constipation and diarrhea.  ?Genitourinary:  Negative for dysuria, frequency and urgency.  ?Musculoskeletal:  Negative for myalgias.  ?Skin:  Negative for rash.  ?Neurological:  Negative for dizziness and headaches.  ?Hematological:  Does not bruise/bleed easily.  ?Psychiatric/Behavioral:  Negative for suicidal ideas. The patient is not nervous/anxious.   ? ? ?   ?Objective:  ?BP 110/64 (BP Location: Left Arm, Patient Position: Sitting, Cuff Size: Normal)   Pulse 80   Temp 97.9 ?F (36.6 ?C) (Temporal)   Ht 5\' 1"  (1.549 m)   Wt 134 lb 12.8 oz (61.1 kg)   BMI 25.47 kg/m?  ?  ? ? ? ?Physical Exam ?Vitals and nursing note reviewed. Exam conducted with a chaperone present.  ?Constitutional:   ?   General: She is not in acute distress. ?HENT:  ?   Right Ear: Tympanic membrane, ear canal and external ear normal.  ?   Left Ear: Tympanic membrane, ear canal and external ear normal.  ?   Nose: Nose normal.  ?Eyes:  ?   General: No scleral icterus. ?   Extraocular Movements: Extraocular movements intact.  ?   Conjunctiva/sclera: Conjunctivae normal.  ?Cardiovascular:  ?   Rate and Rhythm: Normal rate and  regular rhythm.  ?   Pulses: Normal pulses.  ?   Heart sounds: Normal heart sounds.  ?Pulmonary:  ?   Effort: Pulmonary effort is normal. No respiratory distress.  ?   Breath sounds: Normal breath sounds.  ?Chest:  ?Breasts: ?   Breasts are symmetrical.  ?   Right: Normal.  ?   Left: Normal.  ?Abdominal:  ?   General: Bowel sounds are normal. There is no distension.  ?  Palpations: Abdomen is soft.  ?   Hernia: There is no hernia in the left inguinal area or right inguinal area.  ?Genitourinary: ?   General: Normal vulva.  ?   Exam position: Lithotomy position.  ?   Labia:     ?   Right: No rash, tenderness or lesion.     ?   Left: No rash, tenderness or lesion.   ?   Urethra: Prolapse present. No urethral pain or urethral swelling.  ?   Vagina: Normal. No vaginal discharge.  ?   Cervix: Normal.  ?   Uterus: Normal.   ?   Adnexa: Right adnexa normal and left adnexa normal.  ?Musculoskeletal:     ?   General: Normal range of motion.  ?   Cervical back: Normal range of motion and neck supple.  ?   Right lower leg: No edema.  ?   Left lower leg: No edema.  ?Lymphadenopathy:  ?   Cervical: No cervical adenopathy.  ?   Upper Body:  ?   Right upper body: No supraclavicular, axillary or pectoral adenopathy.  ?   Left upper body: No supraclavicular, axillary or pectoral adenopathy.  ?   Lower Body: No right inguinal adenopathy. No left inguinal adenopathy.  ?Skin: ?   General: Skin is warm and dry.  ?Neurological:  ?   Mental Status: She is alert and oriented to person, place, and time.  ?Psychiatric:     ?   Mood and Affect: Mood normal.     ?   Behavior: Behavior normal.     ?   Thought Content: Thought content normal.  ?  ?No results found for any visits on 09/05/21. ?   ?Assessment & Plan:  ?  ?Routine Health Maintenance and Physical Exam ? ?Immunization History  ?Administered Date(s) Administered  ? Influenza,inj,Quad PF,6+ Mos 02/25/2016, 01/25/2018  ? Influenza-Unspecified 03/01/2019  ? PFIZER(Purple Top)SARS-COV-2  Vaccination 08/10/2019, 08/31/2019  ? ? ?Health Maintenance  ?Topic Date Due  ? TETANUS/TDAP  Never done  ? Zoster Vaccines- Shingrix (1 of 2) Never done  ? COLONOSCOPY (Pts 45-31yrs Insurance coverage wil

## 2021-09-05 NOTE — Assessment & Plan Note (Signed)
BP at goal with amlodipine ?BP Readings from Last 3 Encounters:  ?09/05/21 110/64  ?02/28/21 124/84  ?11/05/20 (!) 145/97  ? ?Maintain med dose ?

## 2021-09-05 NOTE — Assessment & Plan Note (Signed)
Unable to schedule appt with ENT. Do not get call. ?Reports improved sinus congestion with oral prednisone and azithromycin for several months. ?recurrent nasal congestion and pressure for several weeks despite use of zyrtec daily and use of flonase prn. ? ?Check x-ray of sinus ?Change flonase to dymista. Advised to use daily ?Maintain daily zyrtec ?

## 2021-09-05 NOTE — Assessment & Plan Note (Signed)
Repeat lipid panel ?Advised to maintain heart healthy diet and regular execise ?

## 2021-09-05 NOTE — Patient Instructions (Addendum)
Schedule appt for annual mammogram at the breast center. ?Go to lab. ?Schedule appt with ENT: [phone]. ? ?Maintain heart healthy diet and daily exercise. ?Perform kegel exercise to help strengthen pelvic floor muscle and bladder control. ? ?Preventive Care 21-50 Years Old, Female ?Preventive care refers to lifestyle choices and visits with your health care provider that can promote health and wellness. Preventive care visits are also called wellness exams. ?What can I expect for my preventive care visit? ?Counseling ?Your health care provider may ask you questions about your: ?Medical history, including: ?Past medical problems. ?Family medical history. ?Pregnancy history. ?Current health, including: ?Menstrual cycle. ?Method of birth control. ?Emotional well-being. ?Home life and relationship well-being. ?Sexual activity and sexual health. ?Lifestyle, including: ?Alcohol, nicotine or tobacco, and drug use. ?Access to firearms. ?Diet, exercise, and sleep habits. ?Work and work Statistician. ?Sunscreen use. ?Safety issues such as seatbelt and bike helmet use. ?Physical exam ?Your health care provider will check your: ?Height and weight. These may be used to calculate your BMI (body mass index). BMI is a measurement that tells if you are at a healthy weight. ?Waist circumference. This measures the distance around your waistline. This measurement also tells if you are at a healthy weight and may help predict your risk of certain diseases, such as type 2 diabetes and high blood pressure. ?Heart rate and blood pressure. ?Body temperature. ?Skin for abnormal spots. ?What immunizations do I need? ? ?Vaccines are usually given at various ages, according to a schedule. Your health care provider will recommend vaccines for you based on your age, medical history, and lifestyle or other factors, such as travel or where you work. ?What tests do I need? ?Screening ?Your health care provider may recommend screening tests for  certain conditions. This may include: ?Lipid and cholesterol levels. ?Diabetes screening. This is done by checking your blood sugar (glucose) after you have not eaten for a while (fasting). ?Pelvic exam and Pap test. ?Hepatitis B test. ?Hepatitis C test. ?HIV (human immunodeficiency virus) test. ?STI (sexually transmitted infection) testing, if you are at risk. ?Lung cancer screening. ?Colorectal cancer screening. ?Mammogram. Talk with your health care provider about when you should start having regular mammograms. This may depend on whether you have a family history of breast cancer. ?BRCA-related cancer screening. This may be done if you have a family history of breast, ovarian, tubal, or peritoneal cancers. ?Bone density scan. This is done to screen for osteoporosis. ?Talk with your health care provider about your test results, treatment options, and if necessary, the need for more tests. ?Follow these instructions at home: ?Eating and drinking ? ?Eat a diet that includes fresh fruits and vegetables, whole grains, lean protein, and low-fat dairy products. ?Take vitamin and mineral supplements as recommended by your health care provider. ?Do not drink alcohol if: ?Your health care provider tells you not to drink. ?You are pregnant, may be pregnant, or are planning to become pregnant. ?If you drink alcohol: ?Limit how much you have to 0-1 drink a day. ?Know how much alcohol is in your drink. In the [place] one drink equals one 12 oz bottle of beer (355 mL), one 5 oz glass of wine (148 mL), or one 1? oz glass of hard liquor (44 mL). ?Lifestyle ?Brush your teeth every morning and night with fluoride toothpaste. Floss one time each day. ?Exercise for at least 30 minutes 5 or more days each week. ?Do not use any products that contain nicotine or tobacco. These products include  cigarettes, chewing tobacco, and vaping devices, such as e-cigarettes. If you need help quitting, ask your health care provider. ?Do not use  drugs. ?If you are sexually active, practice safe sex. Use a condom or other form of protection to prevent STIs. ?If you do not wish to become pregnant, use a form of birth control. If you plan to become pregnant, see your health care provider for a prepregnancy visit. ?Take aspirin only as told by your health care provider. Make sure that you understand how much to take and what form to take. Work with your health care provider to find out whether it is safe and beneficial for you to take aspirin daily. ?Find healthy ways to manage stress, such as: ?Meditation, yoga, or listening to music. ?Journaling. ?Talking to a trusted person. ?Spending time with friends and family. ?Minimize exposure to UV radiation to reduce your risk of skin cancer. ?Safety ?Always wear your seat belt while driving or riding in a vehicle. ?Do not drive: ?If you have been drinking alcohol. Do not ride with someone who has been drinking. ?When you are tired or distracted. ?While texting. ?If you have been using any mind-altering substances or drugs. ?Wear a helmet and other protective equipment during sports activities. ?If you have firearms in your house, make sure you follow all gun safety procedures. ?Seek help if you have been physically or sexually abused. ?What's next? ?Visit your health care provider once a year for an annual wellness visit. ?Ask your health care provider how often you should have your eyes and teeth checked. ?Stay up to date on all vaccines. ?This information is not intended to replace advice given to you by your health care provider. Make sure you discuss any questions you have with your health care provider. ?Document Revised: 10/24/2020 Document Reviewed: 10/24/2020 ?Elsevier Patient Education ? [place]. ? ? ?Kegel Exercises ? ?Kegel exercises can help strengthen your pelvic floor muscles. The pelvic floor is a group of muscles that support your rectum, small intestine, and bladder. In females, pelvic  floor muscles also help support the uterus. These muscles help you control the flow of urine and stool (feces). ?Kegel exercises are painless and simple. They do not require any equipment. Your provider may suggest Kegel exercises to: ?Improve bladder and bowel control. ?Improve sexual response. ?Improve weak pelvic floor muscles after surgery to remove the uterus (hysterectomy) or after pregnancy, in females. ?Improve weak pelvic floor muscles after prostate gland removal or surgery, in males. ?Kegel exercises involve squeezing your pelvic floor muscles. These are the same muscles you squeeze when you try to stop the flow of urine or keep from passing gas. The exercises can be done while sitting, standing, or lying down, but it is best to vary your position. ?Ask your health care provider which exercises are safe for you. Do exercises exactly as told by your health care provider and adjust them as directed. Do not begin these exercises until told by your health care provider. ?Exercises ?How to do Kegel exercises: ?Squeeze your pelvic floor muscles tight. You should feel a tight lift in your rectal area. If you are a female, you should also feel a tightness in your vaginal area. Keep your stomach, buttocks, and legs relaxed. ?Hold the muscles tight for up to 10 seconds. ?Breathe normally. ?Relax your muscles for up to 10 seconds. ?Repeat as told by your health care provider. ?Repeat this exercise daily as told by your health care provider. Continue to do this  exercise for at least 4-6 weeks, or for as long as told by your health care provider. ?You may be referred to a physical therapist who can help you learn more about how to do Kegel exercises. ?Depending on your condition, your health care provider may recommend: ?Varying how long you squeeze your muscles. ?Doing several sets of exercises every day. ?Doing exercises for several weeks. ?Making Kegel exercises a part of your regular exercise routine. ?This  information is not intended to replace advice given to you by your health care provider. Make sure you discuss any questions you have with your health care provider. ?Document Revised: 09/06/2020 Document Reviewed: 0

## 2021-09-06 LAB — IRON,TIBC AND FERRITIN PANEL
%SAT: 24 % (calc) (ref 16–45)
Ferritin: 41 ng/mL (ref 16–232)
Iron: 67 ug/dL (ref 45–160)
TIBC: 285 mcg/dL (calc) (ref 250–450)

## 2021-09-06 NOTE — Addendum Note (Signed)
Addended by: Leana Gamer on: 09/06/2021 02:26 PM ? ? Modules accepted: Orders ? ?

## 2021-09-09 LAB — CYTOLOGY - PAP
Comment: NEGATIVE
Diagnosis: NEGATIVE
Diagnosis: REACTIVE
High risk HPV: NEGATIVE

## 2021-09-30 ENCOUNTER — Observation Stay (HOSPITAL_COMMUNITY)
Admission: EM | Admit: 2021-09-30 | Discharge: 2021-10-01 | Disposition: A | Discharge status: Home / Self Care | Payer: BC Managed Care – PPO | Attending: Internal Medicine | Admitting: Internal Medicine

## 2021-09-30 ENCOUNTER — Other Ambulatory Visit: Payer: Self-pay

## 2021-09-30 ENCOUNTER — Encounter (HOSPITAL_COMMUNITY): Payer: Self-pay | Admitting: Emergency Medicine

## 2021-09-30 ENCOUNTER — Emergency Department (HOSPITAL_COMMUNITY): Payer: BC Managed Care – PPO

## 2021-09-30 ENCOUNTER — Telehealth: Payer: Self-pay | Admitting: Nurse Practitioner

## 2021-09-30 DIAGNOSIS — I517 Cardiomegaly: Secondary | ICD-10-CM | POA: Diagnosis not present

## 2021-09-30 DIAGNOSIS — R0789 Other chest pain: Secondary | ICD-10-CM | POA: Diagnosis not present

## 2021-09-30 DIAGNOSIS — E782 Mixed hyperlipidemia: Secondary | ICD-10-CM | POA: Diagnosis not present

## 2021-09-30 DIAGNOSIS — R079 Chest pain, unspecified: Secondary | ICD-10-CM | POA: Diagnosis present

## 2021-09-30 DIAGNOSIS — I10 Essential (primary) hypertension: Secondary | ICD-10-CM | POA: Diagnosis not present

## 2021-09-30 DIAGNOSIS — I160 Hypertensive urgency: Secondary | ICD-10-CM | POA: Diagnosis present

## 2021-09-30 DIAGNOSIS — Z79899 Other long term (current) drug therapy: Secondary | ICD-10-CM | POA: Diagnosis not present

## 2021-09-30 DIAGNOSIS — E876 Hypokalemia: Secondary | ICD-10-CM | POA: Insufficient documentation

## 2021-09-30 DIAGNOSIS — R072 Precordial pain: Secondary | ICD-10-CM | POA: Diagnosis not present

## 2021-09-30 LAB — CBC
HCT: 43.6 % (ref 36.0–46.0)
Hemoglobin: 14.3 g/dL (ref 12.0–15.0)
MCH: 28.8 pg (ref 26.0–34.0)
MCHC: 32.8 g/dL (ref 30.0–36.0)
MCV: 87.9 fL (ref 80.0–100.0)
Platelets: 224 10*3/uL (ref 150–400)
RBC: 4.96 MIL/uL (ref 3.87–5.11)
RDW: 12.6 % (ref 11.5–15.5)
WBC: 7 10*3/uL (ref 4.0–10.5)
nRBC: 0 % (ref 0.0–0.2)

## 2021-09-30 LAB — BASIC METABOLIC PANEL
Anion gap: 6 (ref 5–15)
BUN: 12 mg/dL (ref 6–20)
CO2: 27 mmol/L (ref 22–32)
Calcium: 9.4 mg/dL (ref 8.9–10.3)
Chloride: 106 mmol/L (ref 98–111)
Creatinine, Ser: 0.59 mg/dL (ref 0.44–1.00)
GFR, Estimated: >60 mL/min (ref >60)
Glucose, Bld: 115 mg/dL — ABNORMAL HIGH (ref 70–99)
Potassium: 3.4 mmol/L — ABNORMAL LOW (ref 3.5–5.1)
Sodium: 139 mmol/L (ref 135–145)

## 2021-09-30 LAB — MAGNESIUM: Magnesium: 1.9 mg/dL (ref 1.7–2.4)

## 2021-09-30 LAB — I-STAT BETA HCG BLOOD, ED (MC, WL, AP ONLY): I-stat hCG, quantitative: <5 m[IU]/mL (ref <5)

## 2021-09-30 LAB — D-DIMER, QUANTITATIVE: D-Dimer, Quant: 0.31 ug/mL-FEU (ref 0.00–0.50)

## 2021-09-30 LAB — BRAIN NATRIURETIC PEPTIDE: B Natriuretic Peptide: 25.9 pg/mL (ref 0.0–100.0)

## 2021-09-30 LAB — TSH: TSH: 1.404 u[IU]/mL (ref 0.350–4.500)

## 2021-09-30 LAB — TROPONIN I (HIGH SENSITIVITY)
Troponin I (High Sensitivity): 3 ng/L (ref <18)
Troponin I (High Sensitivity): 3 ng/L (ref <18)

## 2021-09-30 LAB — HIV ANTIBODY (ROUTINE TESTING W REFLEX): HIV Screen 4th Generation wRfx: NONREACTIVE

## 2021-09-30 MED ORDER — FLUTICASONE PROPIONATE 50 MCG/ACT NA SUSP
1.0000 | Freq: Every day | NASAL | Status: DC
  Administered 2021-09-30: 1 via NASAL
  Filled 2021-09-30: qty 16

## 2021-09-30 MED ORDER — ATORVASTATIN CALCIUM 40 MG PO TABS
40.0000 mg | ORAL_TABLET | Freq: Every day | ORAL | Status: DC
  Administered 2021-09-30 – 2021-10-01 (×2): 40 mg via ORAL
  Filled 2021-09-30 (×2): qty 1

## 2021-09-30 MED ORDER — POTASSIUM CHLORIDE CRYS ER 20 MEQ PO TBCR
40.0000 meq | EXTENDED_RELEASE_TABLET | ORAL | Status: AC
  Administered 2021-09-30: 40 meq via ORAL
  Filled 2021-09-30: qty 2

## 2021-09-30 MED ORDER — LORATADINE 10 MG PO TABS
10.0000 mg | ORAL_TABLET | Freq: Every day | ORAL | Status: DC
Start: 2021-09-30 — End: 2021-10-01
  Administered 2021-09-30: 10 mg via ORAL
  Filled 2021-09-30: qty 1

## 2021-09-30 MED ORDER — AMLODIPINE BESYLATE 5 MG PO TABS
5.0000 mg | ORAL_TABLET | Freq: Every day | ORAL | Status: DC
  Administered 2021-09-30: 5 mg via ORAL
  Filled 2021-09-30: qty 1

## 2021-09-30 MED ORDER — HYDRALAZINE HCL 20 MG/ML IJ SOLN: 10.0000 mg | INTRAMUSCULAR | Status: DC | PRN

## 2021-09-30 MED ORDER — METOPROLOL TARTRATE 50 MG PO TABS
50.0000 mg | ORAL_TABLET | Freq: Two times a day (BID) | ORAL | Status: AC
  Administered 2021-09-30 – 2021-10-01 (×2): 50 mg via ORAL
  Filled 2021-09-30: qty 2
  Filled 2021-09-30: qty 1

## 2021-09-30 MED ORDER — AMLODIPINE BESYLATE 10 MG PO TABS
10.0000 mg | ORAL_TABLET | Freq: Every day | ORAL | Status: DC
  Administered 2021-10-01: 10 mg via ORAL
  Filled 2021-09-30: qty 1

## 2021-09-30 MED ORDER — AZELASTINE HCL 0.1 % NA SOLN
1.0000 | Freq: Two times a day (BID) | NASAL | Status: DC
  Administered 2021-09-30: 1 via NASAL
  Filled 2021-09-30: qty 30

## 2021-09-30 MED ORDER — ACETAMINOPHEN 325 MG PO TABS
650.0000 mg | ORAL_TABLET | Freq: Four times a day (QID) | ORAL | Status: DC | PRN
Start: 2021-09-30 — End: 2021-10-01

## 2021-09-30 MED ORDER — ALBUTEROL SULFATE (2.5 MG/3ML) 0.083% IN NEBU: 2.5000 mg | INHALATION_SOLUTION | Freq: Four times a day (QID) | RESPIRATORY_TRACT | Status: DC | PRN

## 2021-09-30 MED ORDER — ENOXAPARIN SODIUM 40 MG/0.4ML IJ SOSY
40.0000 mg | PREFILLED_SYRINGE | INTRAMUSCULAR | Status: DC
  Administered 2021-09-30: 40 mg via SUBCUTANEOUS
  Filled 2021-09-30: qty 0.4

## 2021-09-30 MED ORDER — AMLODIPINE BESYLATE 5 MG PO TABS
5.0000 mg | ORAL_TABLET | Freq: Once | ORAL | Status: AC
  Administered 2021-09-30: 5 mg via ORAL
  Filled 2021-09-30: qty 1

## 2021-09-30 MED ORDER — ASPIRIN 325 MG PO TABS
325.0000 mg | ORAL_TABLET | Freq: Once | ORAL | Status: AC
  Administered 2021-09-30: 325 mg via ORAL
  Filled 2021-09-30: qty 1

## 2021-09-30 MED ORDER — AZELASTINE-FLUTICASONE 137-50 MCG/ACT NA SUSP: 1.0000 | Freq: Two times a day (BID) | NASAL | Status: DC

## 2021-09-30 MED ORDER — ACETAMINOPHEN 650 MG RE SUPP: 650.0000 mg | Freq: Four times a day (QID) | RECTAL | Status: DC | PRN

## 2021-09-30 NOTE — Telephone Encounter (Signed)
Called pt, she is in [facility] ED.

## 2021-09-30 NOTE — Consult Note (Addendum)
Cardiology Consultation:   Patient ID: [name] MRN: [id number]; DOB: 1970/12/26  Admit date: 09/30/2021 Date of Consult: 09/30/2021  PCP:  Sandra Buffy, NP   [facility] Providers Cardiologist:  Minus Breeding, MD   (NEW)    Patient Profile:   Sandra Huffman is a 51 y.o. female with a hx of hyperlipidemia, iron deficiency anemia and hypertension who is being seen 09/30/2021 for the evaluation of chest pain at the request of Dr. Tamala Julian.   History of Present Illness:   [name] presented for evaluation of chest pain.  First episode occurred about 5 days ago walking up her from sleep.  Felt sharp stabbing in nature.  Associated with shortness of breath.  Self resolved within few minutes.  On Saturday patient had sudden onset of dizziness with cold sweat.  She felt like she is going to pass out.  She was helping her sister at her shop at that time.  Last night she again had a sudden episode of left-sided chest discomfort waking her from sleep.  Associated with shortness of breath and diaphoresis.  No palpitation, orthopnea or PND.  She called her PCP who advised to come to ER for further evaluation.  Chest pain-free currently. Troponin negative x2 D-dimer normal Potassium 3.4 Creatinine normal Chest x-ray showing cardiomegaly  Blood pressure 191/102 on arrival.  Home amlodipine increased to 10 mg daily.  She does not think this is related to acid reflux.  Also reported some shortness of breath while climbing stairs.  denies tobacco smoking, alcohol use or illicit drug abuse.  Father with history of aneurysm repair.   Past Medical History:  Diagnosis Date   History of chicken pox    History of iron deficiency anemia    HTN (hypertension)     History reviewed. No pertinent surgical history.   Inpatient Medications: Scheduled Meds:  [START ON 10/01/2021] amLODipine  10 mg Oral Daily   atorvastatin  40 mg Oral Daily   Azelastine-Fluticasone  1 spray Nasal Q12H   enoxaparin  (LOVENOX) injection  40 mg Subcutaneous Q24H   loratadine  10 mg Oral QHS   Continuous Infusions:  PRN Meds: acetaminophen **OR** acetaminophen, albuterol, hydrALAZINE  Allergies:    Allergies  Allergen Reactions   Iron Anaphylaxis   Ace Inhibitors     cough    Social History:   Social History   Socioeconomic History   Marital status: Married    Spouse name: Not on file   Number of children: Not on file   Years of education: Not on file   Highest education level: Not on file  Occupational History   Not on file  Tobacco Use   Smoking status: Never   Smokeless tobacco: Never  Vaping Use   Vaping Use: Never used  Substance and Sexual Activity   Alcohol use: No   Drug use: No   Sexual activity: Yes    Birth control/protection: Post-menopausal  Other Topics Concern   Not on file  Social History Narrative   Caffeine: none   Lives with husband and daughter (2010), no pets, 95 yo grown daughter   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  All other ROS reviewed and negative.     Physical Exam/Data:   Vitals:   09/30/21 1430 09/30/21 1615 09/30/21 1700 09/30/21 1800  BP: (!) 152/90 (!) 148/91 (!) 141/84 (!) 139/95  Pulse: 78 81 69 84  Resp: (!) 22 16 16  (!) 25  Temp:      SpO2: 99% 98% 97% 96%   No intake or output data in the 24 hours ending 09/30/21 1942     09/05/2021    1:01 PM 02/28/2021    1:33 PM 04/16/2020    8:18 AM  Last 3 Weights  Weight (lbs) 134 lb 12.8 oz 140 lb 3.2 oz 137 lb  Weight (kg) 61.145 kg 63.594 kg 62.143 kg     There is no height or weight on file to calculate BMI.  General:  Well nourished, well developed, in no acute distress HEENT: normal Neck: no JVD Vascular: No carotid bruits; Distal pulses 2+ bilaterally Cardiac:  normal S1, S2; RRR; no murmur  Lungs:  clear to auscultation bilaterally, no wheezing, rhonchi or rales  Abd: soft, nontender, no hepatomegaly  Ext: no edema Musculoskeletal:  No deformities, BUE and BLE strength normal and equal Skin: warm and dry  Neuro:  CNs 2-12 intact, no focal abnormalities noted Psych:  Normal affect   EKG:  The EKG was personally reviewed and demonstrates: Normal sinus rhythm Telemetry:  Telemetry was personally reviewed and demonstrates: Sinus rhythm  Relevant CV Studies: N/A  Laboratory Data:  High Sensitivity Troponin:   Recent Labs  Lab 09/30/21 1008 09/30/21 1255  TROPONINIHS 3 3     Chemistry Recent Labs  Lab 09/30/21 1008  NA 139  K 3.4*  CL 106  CO2 27  GLUCOSE 115*  BUN 12  CREATININE 0.59  CALCIUM 9.4  GFRNONAA >60  ANIONGAP 6     Hematology Recent Labs  Lab 09/30/21 1008  WBC 7.0  RBC 4.96  HGB 14.3  HCT 43.6  MCV 87.9  MCH 28.8  MCHC 32.8  RDW 12.6  PLT 224    DDimer  Recent Labs  Lab 09/30/21 1515  DDIMER 0.31     Radiology/Studies:  DG Chest 2 View  Result Date: 09/30/2021 CLINICAL DATA:  Chest pain EXAM: CHEST - 2 VIEW COMPARISON:  11/05/2020 FINDINGS: Mild cardiomegaly noted. There is no evidence of focal airspace disease, pulmonary edema, suspicious pulmonary nodule/mass, pleural effusion, or pneumothorax. No acute bony abnormalities are identified. IMPRESSION: Mild cardiomegaly without evidence of acute cardiopulmonary disease. Electronically Signed   By: Margarette Canada M.D.   On: 09/30/2021 10:27     Assessment  and Plan:   Chest pain -Sounds atypical.  Each episode lasting for few minutes.  Recently had episode of diaphoresis and nausea while working.  Reported shortness of breath while climbing stairs.  Cardiac risk factor includes hypertension and untreated hyperlipidemia.  Blood pressure elevated on arrival.  Patient has ruled out.  Troponin negative.  EKG without acute ischemic changes (EKG read as resolved lateral changes but it was lead revision). -Likely coronary CTA  tomorrow if heart rate allows.  Will review with MD  2.  Hypertension -Blood pressure elevated  -Agree with increasing amlodipine to 10 mg daily  3.  Hyperlipidemia -Recent lipid panel showed elevated profile as below: -09/05/2021: Cholesterol 258; HDL 48.90; Triglycerides 288.0; VLDL 57.6  -Also had mildly elevated LFT at that time.  Recheck tomorrow.  4.  Hypokalemia -Supplemented  Dr. Percival Spanish to see   Risk Assessment/Risk Scores:   HEAR Score (for undifferentiated chest pain):  HEAR Score: 3{   For questions or updates, please contact [place] Please consult [email or web address] for contact info under    Jarrett Soho, PA  09/30/2021 7:42 PM  History and all data above reviewed.  Patient examined.  I agree with the findings as above.  The patient presents for evaluation of chest pain.  She has no past cardiac history but she does have cardiovascular risk factors.   She presents with chest pain as described above.  This was the third episode in a week.  There are anginal equal to non anginal features.  EKG with questionable lateral T wave inversions anteriorly but these do not appear to be clinically significant.  The patient exam reveals COR:RRR, systolic murmur, slightly more pronounced with strain phase of [place].    ,  Lungs: Clear  ,  Abd: Positive bowel sounds, no rebound no guarding, Ext No edema  .  All available labs, radiology testing, previous records reviewed. Agree with documented assessment  and plan.   Chest pain:  Plan beta blocker tonight and as needed in the AM followed by coronary CTA.  (Could be as an out patient if it is not possible tomorrow. )   Murmur:  Out patient echo and then follow up with me.     Dyslipidemia:  Goals of therapy based on the results of the CT.   HTN:  Resume previous meds.  Patient is advised to keep a BP diary.    Jeneen Rinks Aayansh Codispoti  8:06 PM  09/30/2021

## 2021-09-30 NOTE — ED Provider Notes (Signed)
[facility] EMERGENCY DEPARTMENT Provider Note   CSN: [id number] Arrival date & time: 09/30/21  0944     History  Chief Complaint  Patient presents with   Chest Pain    Sandra Huffman is a 51 y.o. female.  Patient reports she had an unusual sensation in her chest that awoke her from sleep she reports this is happened 2 times previously.  She reports feeling like her heart is pounding.  Patient reports she became concerned when she had a another episode this a.m.  Patient denies any other symptoms she has not had any pain in her arm any pain in her neck she denies any radiation patient denies fever or chills she has not had any cough she denies any shortness of breath patient has a past medical history of anemia hypertension and hyperlipidemia.  The history is provided by the patient. No language interpreter was used.  Chest Pain Pain location:  L lateral chest Pain quality: aching   Pain radiates to:  Does not radiate Pain severity:  Moderate Onset quality:  Gradual Timing:  Constant Progression:  Worsening Chronicity:  New     Home Medications Prior to Admission medications   Medication Sig Start Date End Date Taking? Authorizing Provider  amLODipine (NORVASC) 5 MG tablet TAKE 1 TABLET DAILY 06/03/21   Nche, Bonna Gains, NP  Azelastine-Fluticasone 137-50 MCG/ACT SUSP Place 1 spray into the nose every 12 (twelve) hours. 09/05/21   Nche, Bonna Gains, NP  cetirizine (ZYRTEC) 10 MG tablet TAKE 1 TABLET(10 MG) BY MOUTH AT BEDTIME 09/05/21   Nche, Bonna Gains, NP  Multiple Vitamin (MULTIVITAMIN) tablet Take 1 tablet by mouth daily.    [provider]  hydrochlorothiazide (HYDRODIURIL) 12.5 MG tablet Take 1 tablet (12.5 mg total) by mouth daily. 07/09/11 07/25/11  Dianne Dun, MD  spironolactone (ALDACTONE) 25 MG tablet Take 1 tablet (25 mg total) by mouth daily. 07/09/11 07/25/11  Dianne Dun, MD      Allergies    Iron and Ace inhibitors    Review of  Systems   Review of Systems  Cardiovascular:  Positive for chest pain.  All other systems reviewed and are negative.  Physical Exam Updated Vital Signs BP (!) 152/90   Pulse 78   Temp 97.7 F (36.5 C)   Resp (!) 22   SpO2 99%  Physical Exam Vitals and nursing note reviewed.  Constitutional:      Appearance: She is well-developed.  HENT:     Head: Normocephalic.  Cardiovascular:     Rate and Rhythm: Normal rate and regular rhythm.     Heart sounds: Normal heart sounds.  Pulmonary:     Effort: Pulmonary effort is normal.     Breath sounds: Normal breath sounds.  Abdominal:     General: Bowel sounds are normal. There is no distension.     Palpations: Abdomen is soft.  Musculoskeletal:        General: Normal range of motion.     Cervical back: Normal range of motion.  Skin:    General: Skin is warm.  Neurological:     Mental Status: She is alert and oriented to person, place, and time.    ED Results / Procedures / Treatments   Labs (all labs ordered are listed, but only abnormal results are displayed) Labs Reviewed  BASIC METABOLIC PANEL - Abnormal; Notable for the following components:      Result Value   Potassium 3.4 (*)  Glucose, Bld 115 (*)    All other components within normal limits  CBC  I-STAT BETA HCG BLOOD, ED ([facility], [facility], [facility] ONLY)  TROPONIN I (HIGH SENSITIVITY)  TROPONIN I (HIGH SENSITIVITY)    EKG EKG Interpretation  Date/Time:  Monday Sep 30 2021 09:54:50 EDT Ventricular Rate:  88 PR Interval:  152 QRS Duration: 80 QT Interval:  354 QTC Calculation: 428 R Axis:   182 Text Interpretation: Normal sinus rhythm Right superior axis deviation Low voltage QRS Cannot rule out Anterior infarct , age undetermined T wave abnormality, consider lateral ischemia No previous ECGs available Confirmed by Gwyneth Sprout ([id number]) on 09/30/2021 11:48:48 AM  Radiology DG Chest 2 View  Result Date: 09/30/2021 CLINICAL DATA:  Chest pain EXAM: CHEST - 2 VIEW  COMPARISON:  11/05/2020 FINDINGS: Mild cardiomegaly noted. There is no evidence of focal airspace disease, pulmonary edema, suspicious pulmonary nodule/mass, pleural effusion, or pneumothorax. No acute bony abnormalities are identified. IMPRESSION: Mild cardiomegaly without evidence of acute cardiopulmonary disease. Electronically Signed   By: Harmon Pier M.D.   On: 09/30/2021 10:27    Procedures Procedures    Medications Ordered in ED Medications - No data to display  ED Course/ Medical Decision Making/ A&P                           Medical Decision Making Amount and/or Complexity of Data Reviewed External Data Reviewed: notes.    Details: primary care notes reviewed.  Pt has hypertension Labs: ordered.    Details: Troponin is negative x 2. Radiology: ordered and independent interpretation performed. Decision-making details documented in ED Course.    Details: Chest xray  shows mild cardiomegally ECG/medicine tests: ordered and independent interpretation performed. Decision-making details documented in ED Course.    Details: EKG  shows nonspecific t wave changes Discussion of management or test interpretation with external provider(s): Hospitalist consulted for admission   Risk Decision regarding hospitalization. Risk Details: Patient had initial EKG changes that have resolved troponin is negative x2 I spoke with hospitalist to admit patient           Final Clinical Impression(s) / ED Diagnoses Final diagnoses:  Chest pain, unspecified type    Rx / DC Orders ED Discharge Orders     None         Osie Cheeks 09/30/21 1537    Gwyneth Sprout, MD 10/03/21 [phone]

## 2021-09-30 NOTE — ED Triage Notes (Signed)
Patient coming from home, complaint of chest pain and shortness of breath that has been occurring at night. States she will wake up suddenly with the pain. Endorses HBP, sis not take medication today.

## 2021-09-30 NOTE — ED Notes (Signed)
Pt able to ambulate to the bathroom with no assistance.

## 2021-09-30 NOTE — H&P (Addendum)
History and Physical    PatientAvriana Huffman ZGY:[id number] DOB: Jan 01, 1971 DOA: 09/30/2021 DOS: the patient was seen and examined on 09/30/2021 PCP: Sandra Ng, NP  Patient coming from: Home  Chief Complaint:  Chief Complaint  Patient presents with   Chest Pain   HPI: Sandra Huffman is a 51 y.o. female with medical history significant of hypertension, hyperlipidemia, sinusitis, and iron deficiency anemia who presented with complaints of chest pain.  The first episode happened 5 days ago and she was woken out of her sleep with complaints of left-sided chest discomfort with feelings of shortness of breath upon wakening.  Symptoms resolved on their own.  2 days ago she reports feeling dizzy and cold with complaints of nausea all day.  Denied having any chest pain at that time.  However last night she was woken out of her sleep around 3 AM with a sharp substernal chest discomfort, shortness of breath, and diaphoresis that seem to last 10 minutes or so before resolving on its own.  Denies having any significant leg swelling, calf pain, orthopnea, recent travel, or palpitations.  She had been gardening about 2 weeks ago and notes that she got bit by possibly a tick on the left side of her chest that she removed.  She does not believe that it was there very long prior to removing it, but she has had some mild redness of skin present for the last 2 weeks.  At home she had noticed that her blood pressures were not well controlled on amlodipine which she had been on for the last 6 years.  She is currently not on any cholesterol-lowering medications.  Upon admission into the emergency department patient was noted to be afebrile with heart rate 76-102, respirations 14-22, blood pressures 134/89 -191/101, and O2 saturation maintained on room air.  Labs significant for potassium 3.4 and high-sensitivity troponin negative x2.  Patient's initial EKG noted T wave inversions in the lateral leads, but repeat check  resolution.  Review of Systems: As mentioned in the history of present illness. All other systems reviewed and are negative. Past Medical History:  Diagnosis Date   History of chicken pox    History of iron deficiency anemia    HTN (hypertension)    History reviewed. No pertinent surgical history. Social History:  reports that she has never smoked. She has never used smokeless tobacco. She reports that she does not drink alcohol and does not use drugs.  Allergies  Allergen Reactions   Iron Anaphylaxis   Ace Inhibitors     cough    Family History  Problem Relation Age of Onset   Cancer Mother 71       lung, chewing tobacco   Hypertension Father    Diabetes Sister    Coronary artery disease Neg Hx    Stroke Neg Hx    Breast cancer Neg Hx     Prior to Admission medications   Medication Sig Start Date End Date Taking? Authorizing Provider  amLODipine (NORVASC) 5 MG tablet TAKE 1 TABLET DAILY 06/03/21  Yes Nche, Bonna Gains, NP  Azelastine-Fluticasone 137-50 MCG/ACT SUSP Place 1 spray into the nose every 12 (twelve) hours. 09/05/21  Yes Nche, Bonna Gains, NP  cetirizine (ZYRTEC) 10 MG tablet TAKE 1 TABLET(10 MG) BY MOUTH AT BEDTIME 09/05/21  Yes Nche, Bonna Gains, NP  hydrochlorothiazide (HYDRODIURIL) 12.5 MG tablet Take 1 tablet (12.5 mg total) by mouth daily. 07/09/11 07/25/11  Dianne Dun, MD  spironolactone (ALDACTONE) 25  MG tablet Take 1 tablet (25 mg total) by mouth daily. 07/09/11 07/25/11  Dianne Dun, MD    Physical Exam: Vitals:   09/30/21 1200 09/30/21 1230 09/30/21 1430 09/30/21 1615  BP: (!) 148/90 134/86 (!) 152/90 (!) 148/91  Pulse: 80 76 78 81  Resp: 15 15 (!) 22 16  Temp:      SpO2: 98% 98% 99% 98%   Constitutional: Middle-aged female currently in no acute distress Eyes: PERRL, lids and conjunctivae normal ENMT: Mucous membranes are moist. Normal dentition.  Neck: normal, supple, no masses, no thyromegaly Respiratory: clear to auscultation  bilaterally, no wheezing, no crackles. Normal respiratory effort.   Cardiovascular: Regular rate and rhythm, no murmurs / rubs / gallops.   No significant lower extremity edema. Abdomen: no tenderness, no masses palpated.   Bowel sounds positive.  Musculoskeletal: no clubbing / cyanosis. No joint deformity upper and lower extremities.  Normal muscle tone.  Skin: Mild erythema of the left chest wall. Neurologic: CN 2-12 grossly intact.  Strength 5/5 in all 4.  Psychiatric: Normal judgment and insight. Alert and oriented x 3. Normal mood.   Data Reviewed:  Initial EKG noted T wave inversions in the lateral leads, but resolved on repeat EKG.  Assessment and Plan: Chest pain  cardiomegaly Patient presents with complaints of intermittent chest pains with reports of shortness of breath and diaphoresis.  Initial EKG noted T wave inversions.  High-sensitivity troponins were negative x2.  Repeat EKG did with resolution of initial findings.  Chest x-ray did note mild cardiomegaly.  Patient without significant family history.  Risk factors include hypertension uncontrolled and -Admit to a cardiac telemetry bed -Check BMP, TSH, and lipid panel -Check echocardiogram -Follow-up telemetry overnight -Cardiology consulted, will follow-up for any further recommendations  Hypertensive urgency Acute.  On admission blood pressure was noted to be elevated up to 191/102.  Home blood pressure regimen includes amlodipine 5 mg daily. -Increase amlodipine to 10 mg daily -Hydralazine IV as needed for elevated blood pressure  Hypokalemia Acute.  Potassium 3.4 on admission. -Give 40 mEq of potassium chloride p.o. x1 -Continue to monitor and replace as needed  Dyslipidemia Last lipid panel from 09/05/2021 noted total cholesterol 258, HDL 48.9, LDL 163, and triglycerides 288.  Review of records note cholesterol levels have been uncontrolled since 2015. -Start atorvastatin 40 mg daily  Suspected tick bite Patient  has mild erythema of the chest wall after reported tick bite while gardening.  She doubts tick was present for very long prior to removing it.     Advance Care Planning:   Code Status: Full Code  Consults: Cardiology  Family Communication: Husband updated at bedside  Severity of Illness: The appropriate patient status for this patient is OBSERVATION. Observation status is judged to be reasonable and necessary in order to provide the required intensity of service to ensure the patient's safety. The patient's presenting symptoms, physical exam findings, and initial radiographic and laboratory data in the context of their medical condition is felt to place them at decreased risk for further clinical deterioration. Furthermore, it is anticipated that the patient will be medically stable for discharge from the hospital within 2 midnights of admission.   Author: Clydie Braun, MD 09/30/2021 5:21 PM  For on call review [email or web address].

## 2021-09-30 NOTE — Telephone Encounter (Signed)
Pt called in stating "I feel like I might be having a heart attack, I woke up this morning because my heart was racing so fast at 5am". She says this has happened a couple of mornings now. I transferred her over to nurse triage.

## 2021-10-01 ENCOUNTER — Observation Stay (HOSPITAL_BASED_OUTPATIENT_CLINIC_OR_DEPARTMENT_OTHER): Payer: BC Managed Care – PPO

## 2021-10-01 ENCOUNTER — Observation Stay (HOSPITAL_COMMUNITY): Payer: BC Managed Care – PPO

## 2021-10-01 DIAGNOSIS — R079 Chest pain, unspecified: Secondary | ICD-10-CM | POA: Diagnosis not present

## 2021-10-01 DIAGNOSIS — R072 Precordial pain: Secondary | ICD-10-CM

## 2021-10-01 DIAGNOSIS — I517 Cardiomegaly: Secondary | ICD-10-CM

## 2021-10-01 LAB — COMPREHENSIVE METABOLIC PANEL
ALT: 38 U/L (ref 0–44)
AST: 32 U/L (ref 15–41)
Albumin: 3.3 g/dL — ABNORMAL LOW (ref 3.5–5.0)
Alkaline Phosphatase: 66 U/L (ref 38–126)
Anion gap: 6 (ref 5–15)
BUN: 15 mg/dL (ref 6–20)
CO2: 25 mmol/L (ref 22–32)
Calcium: 9.1 mg/dL (ref 8.9–10.3)
Chloride: 110 mmol/L (ref 98–111)
Creatinine, Ser: 0.69 mg/dL (ref 0.44–1.00)
GFR, Estimated: >60 mL/min (ref >60)
Glucose, Bld: 103 mg/dL — ABNORMAL HIGH (ref 70–99)
Potassium: 3.9 mmol/L (ref 3.5–5.1)
Sodium: 141 mmol/L (ref 135–145)
Total Bilirubin: 0.7 mg/dL (ref 0.3–1.2)
Total Protein: 7.3 g/dL (ref 6.5–8.1)

## 2021-10-01 LAB — CBC
HCT: 40.1 % (ref 36.0–46.0)
Hemoglobin: 13.1 g/dL (ref 12.0–15.0)
MCH: 29.2 pg (ref 26.0–34.0)
MCHC: 32.7 g/dL (ref 30.0–36.0)
MCV: 89.3 fL (ref 80.0–100.0)
Platelets: 224 10*3/uL (ref 150–400)
RBC: 4.49 MIL/uL (ref 3.87–5.11)
RDW: 12.4 % (ref 11.5–15.5)
WBC: 7.7 10*3/uL (ref 4.0–10.5)
nRBC: 0 % (ref 0.0–0.2)

## 2021-10-01 LAB — ECHOCARDIOGRAM COMPLETE
Area-P 1/2: 4.68 cm2
Calc EF: 59 %
Height: 61 in
S' Lateral: 2.7 cm
Single Plane A2C EF: 57.3 %
Single Plane A4C EF: 60.9 %
Weight: 2129.6 oz

## 2021-10-01 MED ORDER — CARVEDILOL 6.25 MG PO TABS: 6.2500 mg | ORAL_TABLET | Freq: Two times a day (BID) | ORAL | 0 refills | Status: DC

## 2021-10-01 MED ORDER — CARVEDILOL 6.25 MG PO TABS: 6.2500 mg | ORAL_TABLET | Freq: Two times a day (BID) | ORAL | Status: DC

## 2021-10-01 MED ORDER — AMLODIPINE BESYLATE 10 MG PO TABS: 10.0000 mg | ORAL_TABLET | Freq: Every day | ORAL | 0 refills | Status: DC

## 2021-10-01 MED ORDER — NITROGLYCERIN 0.4 MG SL SUBL
0.8000 mg | SUBLINGUAL_TABLET | Freq: Once | SUBLINGUAL | Status: AC
Start: 2021-10-01 — End: 2021-10-01

## 2021-10-01 MED ORDER — IOHEXOL 350 MG/ML SOLN
100.0000 mL | Freq: Once | INTRAVENOUS | Status: AC | PRN
  Administered 2021-10-01: 100 mL via INTRAVENOUS

## 2021-10-01 MED ORDER — ATORVASTATIN CALCIUM 40 MG PO TABS: 40.0000 mg | ORAL_TABLET | Freq: Every day | ORAL | 0 refills | Status: DC

## 2021-10-01 MED ORDER — NITROGLYCERIN 0.4 MG SL SUBL
SUBLINGUAL_TABLET | SUBLINGUAL | Status: AC
  Administered 2021-10-01: 0.8 mg via SUBLINGUAL
  Filled 2021-10-01: qty 2

## 2021-10-01 MED ORDER — METOPROLOL TARTRATE 5 MG/5ML IV SOLN
INTRAVENOUS | Status: AC
  Administered 2021-10-01: 10 mg via INTRAVENOUS
  Filled 2021-10-01: qty 10

## 2021-10-01 MED ORDER — METOPROLOL TARTRATE 5 MG/5ML IV SOLN: 10.0000 mg | Freq: Once | INTRAVENOUS | Status: AC

## 2021-10-01 NOTE — Progress Notes (Signed)
Progress Note  Patient Name: Sandra Huffman Date of Encounter: 10/01/2021  Primary Cardiologist: Rollene Rotunda, MD   Subjective   Patient was seen and examined at her bedside.  Inpatient Medications    Scheduled Meds:  amLODipine  10 mg Oral Daily   atorvastatin  40 mg Oral Daily   azelastine  1 spray Each Nare BID   And   fluticasone  1 spray Each Nare Daily   enoxaparin (LOVENOX) injection  40 mg Subcutaneous Q24H   loratadine  10 mg Oral QHS   metoprolol tartrate  50 mg Oral BID   Continuous Infusions:  PRN Meds: acetaminophen **OR** acetaminophen, albuterol, hydrALAZINE   Vital Signs    Vitals:   10/01/21 0200 10/01/21 0300 10/01/21 0500 10/01/21 0548  BP: 131/80 126/77 114/86 (!) 146/92  Pulse: 69 65 67 64  Resp: 14 10 10 19   Temp:    98 F (36.7 C)  TempSrc:    Oral  SpO2: 94% 94% 95% 98%  Weight:    60.4 kg  Height:    5\' 1"  (1.549 m)   No intake or output data in the 24 hours ending 10/01/21 0942 Filed Weights   10/01/21 0548  Weight: 60.4 kg    Telemetry     Normal sinus rhythm- Personally Reviewed  ECG    None today  - Personally Reviewed  Physical Exam    General: Comfortable Head: Atraumatic, normal size  Eyes: PEERLA, EOMI  Neck: Supple, normal JVD Cardiac: Normal S1, S2; RRR; no murmurs, rubs, or gallops Lungs: Clear to auscultation bilaterally Abd: Soft, nontender, no hepatomegaly  Ext: warm, no edema Musculoskeletal: No deformities, BUE and BLE strength normal and equal Skin: Warm and dry, no rashes   Neuro: Alert and oriented to person, place, time, and situation, CNII-XII grossly intact, no focal deficits  Psych: Normal mood and affect   Labs    Chemistry Recent Labs  Lab 09/30/21 1008 10/01/21 0324  NA 139 141  K 3.4* 3.9  CL 106 110  CO2 27 25  GLUCOSE 115* 103*  BUN 12 15  CREATININE 0.59 0.69  CALCIUM 9.4 9.1  PROT  --  7.3  ALBUMIN  --  3.3*  AST  --  32  ALT  --  38  ALKPHOS  --  66  BILITOT  --  0.7   GFRNONAA >60 >60  ANIONGAP 6 6     Hematology Recent Labs  Lab 09/30/21 1008 10/01/21 0324  WBC 7.0 7.7  RBC 4.96 4.49  HGB 14.3 13.1  HCT 43.6 40.1  MCV 87.9 89.3  MCH 28.8 29.2  MCHC 32.8 32.7  RDW 12.6 12.4  PLT 224 224    Cardiac EnzymesNo results for input(s): TROPONINI in the last 168 hours. No results for input(s): TROPIPOC in the last 168 hours.   BNP Recent Labs  Lab 09/30/21 2023  BNP 25.9     DDimer  Recent Labs  Lab 09/30/21 1515  DDIMER 0.31     Radiology    DG Chest 2 View  Result Date: 09/30/2021 CLINICAL DATA:  Chest pain EXAM: CHEST - 2 VIEW COMPARISON:  11/05/2020 FINDINGS: Mild cardiomegaly noted. There is no evidence of focal airspace disease, pulmonary edema, suspicious pulmonary nodule/mass, pleural effusion, or pneumothorax. No acute bony abnormalities are identified. IMPRESSION: Mild cardiomegaly without evidence of acute cardiopulmonary disease. Electronically Signed   By: 10/02/2021 M.D.   On: 09/30/2021 10:27    Cardiac Studies  Patient Profile     51 y.o. female hyperlipidemia, iron deficiency anemia and hypertension  presented for chest pain.  Assessment & Plan    Chest pain - has resolve. Plan for CCTA today.  Hypertension -antihypertensive still needs to be adjusted.  Her amlodipine was increased yesterday.  We will change metoprolol to tartrate to carvedilol 6.25 mg twice daily and titrate up as appropriate.  Hyperlipidemia-continue current statin atorvastatin 40 mg daily.  Electrolyte imbalance-resolved     For questions or updates, please contact [facility] Please consult [email or web address] for contact info under Cardiology/STEMI.      SignedThomasene Ripple, DO  10/01/2021, 9:42 AM

## 2021-10-01 NOTE — Hospital Course (Addendum)
51 y.o. female with medical history significant of hypertension, hyperlipidemia, sinusitis, and iron deficiency anemia who presented with complaints of chest pain.  The first episode happened 5 days PTA, she was woken out of her sleep with complaints of left-sided chest discomfort with feelings of shortness of breath upon wakening.  Symptoms resolved on their own.  2 days PTA she reports feeling dizzy and cold with complaints of nausea all day.  Denied having any chest pain at that time.  However 5/21 night she was woken out of her sleep around 3 AM with a sharp substernal chest discomfort, shortness of breath, and diaphoresis that seem to last 10 minutes or so before resolving on its own.  Denies having any significant leg swelling, calf pain, orthopnea, recent travel, or palpitations.  She had been gardening about 2 weeks ago and notes that she got bit by possibly a tick on the left side of her chest that she removed.  She does not believe that it was there very long prior to removing it, but she has had some mild redness of skin present for the last 2 weeks.  At home she had noticed that her blood pressures were not well controlled on amlodipine which she had been on for the last 6 years.  She is currently not on any cholesterol-lowering medications. Upon admission into the emergency department patient was noted to be afebrile with heart rate 76-102, respirations 14-22, blood pressures 134/89 -191/101, and O2 saturation maintained on room air.  Labs significant for potassium 3.4 and high-sensitivity troponin negative x2.  Patient's initial EKG noted T wave inversions in the lateral leads, but repeat check resolution. 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  Echo showed EF 55 to 60%, no RWMA, grade 1 diastolic dysfunction, normal RV function. CT coronaries calcium zero and no other acute  findings. Discussed with Dr Harriet Masson and okay for dc home on coreg, amlodipine and atorvastatin. Patient is chest pain free.

## 2021-10-01 NOTE — Progress Notes (Signed)
  Echocardiogram 2D Echocardiogram has been performed.  Augustine Radar 10/01/2021, 9:11 AM

## 2021-10-01 NOTE — Discharge Summary (Signed)
Physician Discharge Summary  Sandra Huffman UYQ:[id number] DOB: [birth year] DOA: 09/30/2021  PCP: Anne Ng, NP  Admit date: 09/30/2021 Discharge date: 10/01/2021 Recommendations for Outpatient Follow-up:  Follow up with PCP in 1 weeks-call for appointment Please obtain BMP/CBC in one week  Discharge Dispo: home Discharge Condition: Stable Code Status:   Code Status: Full Code Diet recommendation:  Diet Order             Diet Heart Room service appropriate? Yes; Fluid consistency: Thin  Diet effective now                    Brief/Interim Summary:  51 y.o. female with medical history significant of hypertension, hyperlipidemia, sinusitis, and iron deficiency anemia who presented with complaints of chest pain.  The first episode happened 5 days PTA, she was woken out of her sleep with complaints of left-sided chest discomfort with feelings of shortness of breath upon wakening.  Symptoms resolved on their own.  2 days PTA she reports feeling dizzy and cold with complaints of nausea all day.  Denied having any chest pain at that time.  However 5/21 night she was woken out of her sleep around 3 AM with a sharp substernal chest discomfort, shortness of breath, and diaphoresis that seem to last 10 minutes or so before resolving on its own.  Denies having any significant leg swelling, calf pain, orthopnea, recent travel, or palpitations.  She had been gardening about 2 weeks ago and notes that she got bit by possibly a tick on the left side of her chest that she removed.  She does not believe that it was there very long prior to removing it, but she has had some mild redness of skin present for the last 2 weeks.  At home she had noticed that her blood pressures were not well controlled on amlodipine which she had been on for the last 6 years.  She is currently not on any cholesterol-lowering medications. Upon admission into the emergency department patient was noted to be afebrile with heart rate  76-102, respirations 14-22, blood pressures 134/89 -191/101, and O2 saturation maintained on room air.  Labs significant for potassium 3.4 and high-sensitivity troponin negative x2.  Patient's initial EKG noted T wave inversions in the lateral leads, but repeat check resolution. 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  Echo showed EF 55 to 60%, no RWMA, grade 1 diastolic dysfunction, normal RV function. CT coronaries calcium zero and no other acute findings. Discussed with Dr Servando Salina and okay for dc home on coreg, amlodipine and atorvastatin. Patient is chest pain free.   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  Echo showed EF 55 to 60%, no RWMA, grade 1 diastolic dysfunction, normal RV function. CT coronaries calcium zero and no other acute findings. Discussed with Dr Servando Salina and okay for dc home on coreg, amlodipine and atorvastatin. Patient is chest pain free.   Hypertensive urgency- Bp improved on new regimen amlodipine 10 and coreg  Mixed hyperlipidemia-cont lipitor   Consults: cardiology Subjective: Aa0, no chest pain ambulating well.  Discharge Exam: Vitals:   10/01/21 0946 10/01/21 1146  BP: 127/87   Pulse: 88   Resp:    Temp: 97.9 F (36.6 C) 98.5 F (36.9 C)  SpO2: 97%    General:  Pt is alert, awake, not in acute distress Cardiovascular: RRR, S1/S2 +, no rubs, no gallops Respiratory: CTA bilaterally, no wheezing, no rhonchi Abdominal: Soft, NT, ND, bowel sounds + Extremities: no edema, no cyanosis  Discharge Instructions  Discharge Instructions     Discharge instructions   Complete by: As directed    Please call call MD or return to ER for similar or worsening  recurring problem that brought you to hospital or if any fever,nausea/vomiting,abdominal pain, uncontrolled pain, chest pain,  shortness of breath or any other alarming symptoms.  Please follow-up your doctor as instructed in a week time and call the office for appointment.monitor your blood pressure.  Please avoid alcohol, smoking, or any other illicit substance and maintain healthy habits including taking your regular medications as prescribed.  You were cared for by a hospitalist during your hospital stay. If you have any questions about your discharge medications or the care you received while you were in the hospital after you are discharged, you can call the unit and ask to speak with the hospitalist on call if the hospitalist that took care of you is not available.  Once you are discharged, your primary care physician will handle any further medical issues. Please note that NO REFILLS for any discharge medications will be authorized once you are discharged, as it is imperative that you return to your primary care physician (or establish a relationship with a primary care physician if you do not have one) for your aftercare needs so that they can reassess your need for medications and monitor your lab values   Increase activity slowly   Complete by: As directed       Allergies as of 10/01/2021       Reactions   Iron Anaphylaxis   Ace Inhibitors    cough        Medication List     TAKE these medications    amLODipine 10 MG tablet Commonly known as: NORVASC Take 1 tablet (10 mg total) by mouth daily. Start taking on: Oct 02, 2021 What changed:  medication strength how much to take how to take this when to take this additional instructions   atorvastatin 40 MG tablet Commonly known as: LIPITOR Take 1 tablet (40 mg total) by mouth daily. Start taking on: Oct 02, 2021   Azelastine-Fluticasone 137-50 MCG/ACT Susp Place 1 spray into the nose every 12 (twelve) hours.    carvedilol 6.25 MG tablet Commonly known as: COREG Take 1 tablet (6.25 mg total) by mouth 2 (two) times daily with a meal.   cetirizine 10 MG tablet Commonly known as: ZYRTEC TAKE 1 TABLET(10 MG) BY MOUTH AT BEDTIME        Follow-up Information     Nche, Bonna Gains, NP Follow up in 1 week(s).   Specialty: Internal Medicine Contact information: [address] [place] [address] [phone]         Rollene Rotunda, MD .   Specialty: Cardiology Contact information: [address] [place] [address] [phone]                Allergies  Allergen Reactions   Iron Anaphylaxis   Ace Inhibitors     cough    The results of significant diagnostics from this hospitalization (including imaging, microbiology, ancillary and laboratory) are listed below for reference.    Microbiology: No results found for this or any previous visit (from the past 240 hour(s)).  Procedures/Studies: DG Sinus 1-2 Views  Result Date: 09/06/2021 CLINICAL DATA:  Chronic sinusitis EXAM: PARANASAL SINUSES - 3 VIEW COMPARISON:  None. FINDINGS: The paranasal sinus are aerated. There is no evidence of sinus opacification air-fluid levels or mucosal thickening. No significant bone abnormalities are seen. IMPRESSION: Negative. Electronically Signed   By: Sherian ReinWei-Chen  Lin M.D.   On: 09/06/2021 12:30   DG Chest 2 View  Result Date: 09/30/2021 CLINICAL DATA:  Chest pain EXAM: CHEST - 2 VIEW COMPARISON:  11/05/2020 FINDINGS: Mild cardiomegaly noted. There is no evidence of focal airspace disease, pulmonary edema, suspicious pulmonary nodule/mass, pleural effusion, or pneumothorax. No acute bony abnormalities are identified. IMPRESSION: Mild cardiomegaly without evidence of acute cardiopulmonary disease. Electronically Signed   By: Harmon PierJeffrey  Hu M.D.   On: 09/30/2021 10:27   CT CORONARY MORPH W/CTA COR W/SCORE W/CA W/CM &/OR WO/CM  Addendum Date: 10/01/2021   ADDENDUM REPORT:  10/01/2021 12:58 HISTORY: Chest pain/anginal equiv, ECGs and troponins normal EXAM: Cardiac/Coronary CT TECHNIQUE: The patient was scanned on a [employer] Force scanner. PROTOCOL: A 100 kV prospective scan was triggered in the descending thoracic aorta at 111 HU's. Axial non-contrast 3 mm slices were carried out through the heart. The data set was analyzed on a dedicated work station and scored using the Agatston method. Gantry rotation speed was 250 msecs and collimation was .6 mm. Heart rate was optimized medically and sl NTG was given. The 3D data set was reconstructed in 5% intervals of the 35-75 % of the R-R cycle. Systolic and diastolic phases were analyzed on a dedicated work station using MPR, MIP and VRT modes. The patient received 100mL OMNIPAQUE IOHEXOL 350 MG/ML SOLN of contrast. FINDINGS: Coronary calcium score: The patient's coronary artery calcium score is 0, which places the patient in the 0 percentile. Coronary arteries: Normal coronary origins.  Right dominance. Right Coronary Artery: Normal caliber vessel, gives rise to PDA. No significant plaque or stenosis. Left Main Coronary Artery: Normal caliber vessel. No significant plaque or stenosis. Ramus intermedius: Normal caliber vessel. No significant plaque or stenosis. Left Anterior Descending Coronary Artery: Normal caliber vessel. No significant plaque or stenosis. Gives rise to 1 diagonal branch. Left Circumflex Artery: Normal caliber vessel. No significant plaque or stenosis. Gives rise to 3 OM branches. Aorta: Normal size, 32 mm at the mid ascending aorta (level of the PA bifurcation) measured double oblique. No aortic atherosclerosis. No dissection seen in visualized portions of the aorta. Aortic Valve: No calcifications. Trileaflet. Other findings: Normal pulmonary vein drainage into the left atrium. Normal left atrial appendage without a thrombus. Normal size of the pulmonary artery. Normal appearance of the pericardium. IMPRESSION: 1. No  evidence of CAD, CADRADS = 0. 2. Coronary calcium score of 0. This was 0 percentile for age and sex matched control. 3. Normal coronary origin with right dominance. INTERPRETATION: 1. CAD-RADS 0: No evidence of CAD (0%). Consider non-atherosclerotic causes of chest pain. 2. CAD-RADS 1: Minimal non-obstructive CAD (0-24%). Consider non-atherosclerotic causes of chest pain. Consider preventive therapy and risk factor modification. 3. CAD-RADS 2: Mild non-obstructive CAD (25-49%). Consider non-atherosclerotic causes of chest pain. Consider preventive therapy and risk factor modification. 4. CAD-RADS 3: Moderate stenosis (50-69%). Consider symptom-guided anti-ischemic pharmacotherapy as well as risk factor modification per guideline directed care. Additional analysis with CT FFR will be submitted. 5. CAD-RADS 4: Severe stenosis. (70-99% or > 50% left main). Cardiac catheterization or CT FFR is recommended. Consider symptom-guided anti-ischemic pharmacotherapy as well as risk factor modification per guideline directed care. Invasive coronary angiography recommended with  revascularization per published guideline statements. 6. CAD-RADS 5: Total coronary occlusion (100%). Consider cardiac catheterization or viability assessment. Consider symptom-guided anti-ischemic pharmacotherapy as well as risk factor modification per guideline directed care. 7. CAD-RADS N: Non-diagnostic study. Obstructive CAD can't be excluded. Alternative evaluation is recommended. Electronically Signed   By: Jodelle Red M.D.   On: 10/01/2021 12:58   Result Date: 10/01/2021 EXAM: OVER-READ INTERPRETATION  CT CHEST The following report is a limited chest CT over-read performed by radiologist Dr. Genevive Bi of [facility] on 10/01/2021. The CTA interpretation by the cardiologist is attached. COMPARISON:  None Available. FINDINGS: Limited view of the lung parenchyma demonstrates no suspicious nodularity. Airways are normal.  Limited view of the mediastinum demonstrates no adenopathy. Esophagus normal. Limited view of the upper abdomen unremarkable. Limited view of the skeleton and chest wall is unremarkable. IMPRESSION: No significant extracardiac findings. Electronically Signed: By: Genevive Bi M.D. On: 10/01/2021 12:44   ECHOCARDIOGRAM COMPLETE  Result Date: 10/01/2021    ECHOCARDIOGRAM REPORT   Patient Name:   [facility]    Date of Exam: 10/01/2021 Medical Rec #:  [id number]  Height:       61.0 in Accession #:    [id number] Weight:       133.1 lb Date of Birth:  [birth year]   BSA:          1.589 m Patient Age:    51 years   BP:           146/92 mmHg Patient Gender: F          HR:           68 bpm. Exam Location:  Inpatient Procedure: 2D Echo, Cardiac Doppler and Color Doppler Indications:    Chest Pain R07.9                 Cardiomegaly I51.7  History:        Patient has no prior history of Echocardiogram examinations.                 Cardiomegaly; Risk Factors:Dyslipidemia and Hypertension.  Sonographer:    Eulah Pont RDCS Referring Phys: [id number] RONDELL A SMITH IMPRESSIONS  1. Left ventricular ejection fraction, by estimation, is 55 to 60%. Left ventricular ejection fraction by 2D MOD biplane is 59.0 %. The left ventricle has normal function. The left ventricle has no regional wall motion abnormalities. Left ventricular diastolic parameters are consistent with Grade I diastolic dysfunction (impaired relaxation).  2. Right ventricular systolic function is normal. The right ventricular size is normal. Tricuspid regurgitation signal is inadequate for assessing PA pressure.  3. The mitral valve is normal in structure. No evidence of mitral valve regurgitation. No evidence of mitral stenosis.  4. The aortic valve is tricuspid. Aortic valve regurgitation is not visualized. No aortic stenosis is present.  5. The inferior vena cava is normal in size with greater than 50% respiratory variability, suggesting right atrial pressure of 3  mmHg. Comparison(s): No prior Echocardiogram. FINDINGS  Left Ventricle: Left ventricular ejection fraction, by estimation, is 55 to 60%. Left ventricular ejection fraction by 2D MOD biplane is 59.0 %. The left ventricle has normal function. The left ventricle has no regional wall motion abnormalities. The left ventricular internal cavity size was normal in size. There is no left ventricular hypertrophy. Left ventricular diastolic parameters are consistent with Grade I diastolic dysfunction (impaired relaxation). Right Ventricle: The right ventricular size is normal. No increase in right ventricular wall thickness. Right  ventricular systolic function is normal. Tricuspid regurgitation signal is inadequate for assessing PA pressure. Left Atrium: Left atrial size was normal in size. Right Atrium: Right atrial size was normal in size. Pericardium: There is no evidence of pericardial effusion. Mitral Valve: The mitral valve is normal in structure. No evidence of mitral valve regurgitation. No evidence of mitral valve stenosis. Tricuspid Valve: The tricuspid valve is normal in structure. Tricuspid valve regurgitation is trivial. No evidence of tricuspid stenosis. Aortic Valve: The aortic valve is tricuspid. Aortic valve regurgitation is not visualized. No aortic stenosis is present. Pulmonic Valve: The pulmonic valve was not well visualized. Pulmonic valve regurgitation is not visualized. No evidence of pulmonic stenosis. Aorta: The aortic root and ascending aorta are structurally normal, with no evidence of dilitation. Venous: The inferior vena cava is normal in size with greater than 50% respiratory variability, suggesting right atrial pressure of 3 mmHg. IAS/Shunts: No atrial level shunt detected by color flow Doppler.  LEFT VENTRICLE PLAX 2D                        Biplane EF (MOD) LVIDd:         4.50 cm         LV Biplane EF:   Left LVIDs:         2.70 cm                          ventricular LV PW:         1.00 cm                           ejection LV IVS:        1.00 cm                          fraction by LVOT diam:     1.90 cm                          2D MOD LV SV:         48                               biplane is LV SV Index:   30                               59.0 %. LVOT Area:     2.84 cm                                Diastology                                LV e' medial:    4.87 cm/s LV Volumes (MOD)               LV E/e' medial:  16.5 LV vol d, MOD    55.3 ml       LV e' lateral:   7.05 cm/s A2C:  LV E/e' lateral: 11.4 LV vol d, MOD    59.9 ml A4C: LV vol s, MOD    23.6 ml A2C: LV vol s, MOD    23.4 ml A4C: LV SV MOD A2C:   31.7 ml LV SV MOD A4C:   59.9 ml LV SV MOD BP:    34.2 ml RIGHT VENTRICLE RV S prime:     13.10 cm/s TAPSE (M-mode): 1.7 cm LEFT ATRIUM             Index        RIGHT ATRIUM           Index LA diam:        3.40 cm 2.14 cm/m   RA Area:     11.10 cm LA Vol (A2C):   38.2 ml 24.05 ml/m  RA Volume:   25.30 ml  15.93 ml/m LA Vol (A4C):   30.7 ml 19.33 ml/m LA Biplane Vol: 35.0 ml 22.03 ml/m  AORTIC VALVE LVOT Vmax:   90.40 cm/s LVOT Vmean:  62.100 cm/s LVOT VTI:    0.168 m  AORTA Ao Root diam: 2.80 cm Ao Asc diam:  3.00 cm MITRAL VALVE MV Area (PHT): 4.68 cm    SHUNTS MV Decel Time: 162 msec    Systemic VTI:  0.17 m MV E velocity: 80.20 cm/s  Systemic Diam: 1.90 cm MV A velocity: 81.00 cm/s MV E/A ratio:  0.99 Riley Lam MD Electronically signed by Riley Lam MD Signature Date/Time: 10/01/2021/9:47:38 AM    Final     Labs: BNP (last 3 results) Recent Labs    09/30/21 2023  BNP 25.9   Basic Metabolic Panel: Recent Labs  Lab 09/30/21 1008 09/30/21 2023 10/01/21 0324  NA 139  --  141  K 3.4*  --  3.9  CL 106  --  110  CO2 27  --  25  GLUCOSE 115*  --  103*  BUN 12  --  15  CREATININE 0.59  --  0.69  CALCIUM 9.4  --  9.1  MG  --  1.9  --    Liver Function Tests: Recent Labs  Lab 10/01/21 0324  AST 32  ALT 38  ALKPHOS 66   BILITOT 0.7  PROT 7.3  ALBUMIN 3.3*   No results for input(s): LIPASE, AMYLASE in the last 168 hours. No results for input(s): AMMONIA in the last 168 hours. CBC: Recent Labs  Lab 09/30/21 1008 10/01/21 0324  WBC 7.0 7.7  HGB 14.3 13.1  HCT 43.6 40.1  MCV 87.9 89.3  PLT 224 224   Cardiac Enzymes: No results for input(s): CKTOTAL, CKMB, CKMBINDEX, TROPONINI in the last 168 hours. BNP: Invalid input(s): POCBNP CBG: No results for input(s): GLUCAP in the last 168 hours. D-Dimer Recent Labs    09/30/21 1515  DDIMER 0.31   Hgb A1c No results for input(s): HGBA1C in the last 72 hours. Lipid Profile No results for input(s): CHOL, HDL, LDLCALC, TRIG, CHOLHDL, LDLDIRECT in the last 72 hours. Thyroid function studies Recent Labs    09/30/21 2023  TSH 1.404   Anemia work up No results for input(s): VITAMINB12, FOLATE, FERRITIN, TIBC, IRON, RETICCTPCT in the last 72 hours. Urinalysis    Component Value Date/Time   BILIRUBINUR neg 10/17/2019 0944   PROTEINUR Positive (A) 10/17/2019 0944   UROBILINOGEN 0.2 10/17/2019 0944   NITRITE neg 10/17/2019 0944   LEUKOCYTESUR Large (3+) (A) 10/17/2019 0944   Sepsis Labs Invalid input(s): PROCALCITONIN,  WBC,  LACTICIDVEN Microbiology No results found for this or any previous visit (from the past 240 hour(s)).   Time coordinating discharge: 25 minutes  SIGNED: Lanae Boast, MD  [facility] 10/01/2021, 1:07 PM  If 7PM-7AM, please contact night-coverage [email or web address]

## 2021-10-01 NOTE — Plan of Care (Signed)
Admitted and oriented to unit. Vitals stable with systolic BP now 0000000. No complaints of pain. Anticipating further imaging (echo, coronary CT).   Problem: Education: Goal: Knowledge of General Education information will improve Description: Including pain rating scale, medication(s)/side effects and non-pharmacologic comfort measures Outcome: Progressing   Problem: Health Behavior/Discharge Planning: Goal: Ability to manage health-related needs will improve Outcome: Progressing   Problem: Clinical Measurements: Goal: Ability to maintain clinical measurements within normal limits will improve Outcome: Progressing Goal: Will remain free from infection Outcome: Progressing Goal: Diagnostic test results will improve Outcome: Progressing Goal: Respiratory complications will improve Outcome: Progressing Goal: Cardiovascular complication will be avoided Outcome: Progressing   Problem: Activity: Goal: Risk for activity intolerance will decrease Outcome: Progressing   Problem: Nutrition: Goal: Adequate nutrition will be maintained Outcome: Progressing   Problem: Coping: Goal: Level of anxiety will decrease Outcome: Progressing   Problem: Elimination: Goal: Will not experience complications related to bowel motility Outcome: Progressing Goal: Will not experience complications related to urinary retention Outcome: Progressing   Problem: Pain Managment: Goal: General experience of comfort will improve Outcome: Progressing   Problem: Safety: Goal: Ability to remain free from injury will improve Outcome: Progressing   Problem: Skin Integrity: Goal: Risk for impaired skin integrity will decrease Outcome: Progressing

## 2021-10-01 NOTE — Plan of Care (Signed)

## 2021-10-01 NOTE — ED Notes (Signed)
Breakfast order placed ?

## 2021-10-02 ENCOUNTER — Telehealth: Payer: Self-pay | Admitting: Cardiology

## 2021-10-02 NOTE — Telephone Encounter (Signed)
Called the patient's husband back, per dpr. 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  Echo showed EF 55 to 60%, no RWMA, grade 1 diastolic dysfunction, normal RV function. CT coronaries calcium zero and no other acute findings. Discussed with Dr Servando Salina and okay for dc home on coreg, amlodipine and atorvastatin. Patient is chest pain free.   The husband stated that the patient woke up twice last night feeling like she was having a fast heart rate. They checked her blood pressure which was 126/78 but did not get the heart rate. She has been off of caffeine for the past three days. She denied chest pain and shortness of breath. He stated that the patient was currently asymptomatic and that this only happens at night.   She has a follow up on 5/31. He has been advised to keep that appointment and that they may call the on call tonight if needed.

## 2021-10-02 NOTE — Telephone Encounter (Signed)
Patient's spouse called stating last night his wife felt her heart beat two different times really hard.  It woke her up from her sleep. She wants to know what should she do.

## 2021-10-08 NOTE — Progress Notes (Unsigned)
Office Visit    Patient Name: Sandra Huffman Date of Encounter: 10/09/2021  Primary Care Provider:  Anne Ng, NP Primary Cardiologist:  Rollene Rotunda, MD  Chief Complaint    51 year old female with a history of atypical chest pain, hypertension, hyperlipidemia, iron deficiency anemia, and hypokalemia who presents for posthospital follow-up related to chest pain.  Past Medical History    Past Medical History:  Diagnosis Date   History of chicken pox    History of iron deficiency anemia    HTN (hypertension)    No past surgical history on file.  Allergies  Allergies  Allergen Reactions   Iron Anaphylaxis   Ace Inhibitors     cough    History of Present Illness    51 year old female with the above past medical history including atypical chest pain, hypertension, hyperlipidemia, iron deficiency anemia, and hypokalemia.  She presented to the ED on 09/30/2021 with intermittent sharp stabbing chest pain, associated shortness of breath, that woke her from her sleep and resolved spontaneously.  She also reported an episode of sudden onset dizziness with cold sweat, felt like she was going to pass out.  Of note, she was bit by a tick 2 weeks prior. Troponin was negative x2, D-dimer was normal.  Chest x-ray showed cardiomegaly, EKG was unremarkable.  Her potassium was low at 3.4.  Echocardiogram showed EF 55 to 60%, normal LV function, no RWMA, G1 DD, normal RV systolic function, no significant valvular abnormalities.  Coronary CT angiogram showed no evidence of CAD, calcium score of 0.  Metoprolol was switched to carvedilol, and her amlodipine was increased for blood pressure control.  She was discharged home on 10/01/2021 in stable condition.  Her husband called our office on 10/02/2021 and reported the patient woke up feeling like she had a fast heart rate.  BP was stable. She denied any associated chest pain or shortness of breath.  She presents today for follow-up. Since her ED  visit she has been stable overall from a cardiac standpoint. She denies chest pain, dyspnea exertion.  She does report nightly episodes of feeling like her heart is "pounding," with associated shortness of breath.  She states that she feels like she cannot catch her breath when this occurs.  These episodes occur only 3 times a night, last for seconds, and resolve spontaneously.  She denies additional palpitations, dizziness, presyncope, syncope. She does report a personal history of snoring, she also notes some daytime fatigue. Other than her brief episodes of palpitations and shortness of breath/possible apnea, she denies any additional concerns today.  Home Medications    Current Outpatient Medications  Medication Sig Dispense Refill   amLODipine (NORVASC) 10 MG tablet Take 1 tablet (10 mg total) by mouth daily. 30 tablet 0   atorvastatin (LIPITOR) 40 MG tablet Take 1 tablet (40 mg total) by mouth daily. 30 tablet 0   Azelastine-Fluticasone 137-50 MCG/ACT SUSP Place 1 spray into the nose every 12 (twelve) hours. 23 g 5   carvedilol (COREG) 6.25 MG tablet Take 1 tablet (6.25 mg total) by mouth 2 (two) times daily with a meal. 60 tablet 0   cetirizine (ZYRTEC) 10 MG tablet TAKE 1 TABLET(10 MG) BY MOUTH AT BEDTIME 90 tablet 3   No current facility-administered medications for this visit.     Review of Systems    She denies chest pain, dyspnea, pnd, orthopnea, n, v, dizziness, syncope, edema, weight gain, or early satiety. All other systems reviewed and are otherwise  negative except as noted above.   Physical Exam    VS:  BP 124/76   Pulse 64   Ht 5\' 1"  (1.549 m)   Wt 135 lb 12.8 oz (61.6 kg)   SpO2 100%   BMI 25.66 kg/m  , BMI Body mass index is 25.66 kg/m. STOP-Bang Score:  4      GEN: Well nourished, well developed, in no acute distress. HEENT: normal. Neck: Supple, no JVD, carotid bruits, or masses. Cardiac: RRR, no murmurs, rubs, or gallops. No clubbing, cyanosis, edema.   Radials/DP/PT 2+ and equal bilaterally.  Respiratory:  Respirations regular and unlabored, clear to auscultation bilaterally. GI: Soft, nontender, nondistended, BS + x 4. MS: no deformity or atrophy. Skin: warm and dry, no rash. Neuro:  Strength and sensation are intact. Psych: Normal affect.  Accessory Clinical Findings    ECG personally reviewed by me today - No EKG in office today.  Lab Results  Component Value Date   WBC 7.7 10/01/2021   HGB 13.1 10/01/2021   HCT 40.1 10/01/2021   MCV 89.3 10/01/2021   PLT 224 10/01/2021   Lab Results  Component Value Date   CREATININE 0.69 10/01/2021   BUN 15 10/01/2021   NA 141 10/01/2021   K 3.9 10/01/2021   CL 110 10/01/2021   CO2 25 10/01/2021   Lab Results  Component Value Date   ALT 38 10/01/2021   AST 32 10/01/2021   ALKPHOS 66 10/01/2021   BILITOT 0.7 10/01/2021   Lab Results  Component Value Date   CHOL 258 (H) 09/05/2021   HDL 48.90 09/05/2021   LDLCALC 153 (H) 04/16/2020   LDLDIRECT 163.0 09/05/2021   TRIG 288.0 (H) 09/05/2021   CHOLHDL 5 09/05/2021    No results found for: HGBA1C  Assessment & Plan    1. Atypical chest pain: Echo showed EF 55 to 60%, normal LV function, no RWMA, G1 DD, normal RV systolic function, no significant valvular abnormalities. Coronary CT angiogram showed no evidence of CAD, calcium score of 0. Stable with no anginal symptoms.  Euvolemic and well compensated on exam. Continue amlodipine, carvedilol, Lipitor.   2. Palpitations: She reports nightly episodes of feeling like her heart is pounding and she feels like she cannot catch her breath.  Her symptoms last for seconds and resolve spontaneously.  She states these episodes occur approximately 3 times a night.  She denies additional dyspnea, palpitations, dizziness, presyncope, syncope.  Will check 7-day ZIO monitor to rule out underlying arrhythmia. Continue carvedilol.   3. Snoring/daytime fatigue: Stop-bang score of 4. She describes  episodes of waking up feeling like she cannot breathe. She has also been told that she snores by her family members, reports some daytime fatigue.  Symptoms are concerning for possible sleep apnea. Will order Itamar sleep study to rule out OSA.  4. Hypertension: BP well controlled. Continue current antihypertensive regimen.   5. Hyperlipidemia: Direct LDL was 163 in 08/2021.  Recently started on Lipitor.  Will plan for repeat lipids, LFTs at next follow-up visit.  Continue Lipitor.  6. Disposition: Follow-up in 6 weeks.    09/2021, NP 10/09/2021, 8:22 AM

## 2021-10-09 ENCOUNTER — Encounter: Payer: Self-pay | Admitting: Nurse Practitioner

## 2021-10-09 ENCOUNTER — Ambulatory Visit (INDEPENDENT_AMBULATORY_CARE_PROVIDER_SITE_OTHER): Payer: BC Managed Care – PPO

## 2021-10-09 ENCOUNTER — Ambulatory Visit (INDEPENDENT_AMBULATORY_CARE_PROVIDER_SITE_OTHER): Payer: BC Managed Care – PPO | Admitting: Nurse Practitioner

## 2021-10-09 ENCOUNTER — Telehealth: Payer: Self-pay | Admitting: *Deleted

## 2021-10-09 VITALS — BP 124/76 | HR 64 | Ht 61.0 in | Wt 135.8 lb

## 2021-10-09 DIAGNOSIS — R002 Palpitations: Secondary | ICD-10-CM

## 2021-10-09 DIAGNOSIS — I10 Essential (primary) hypertension: Secondary | ICD-10-CM | POA: Diagnosis not present

## 2021-10-09 DIAGNOSIS — E782 Mixed hyperlipidemia: Secondary | ICD-10-CM

## 2021-10-09 DIAGNOSIS — R0683 Snoring: Secondary | ICD-10-CM

## 2021-10-09 DIAGNOSIS — R0789 Other chest pain: Secondary | ICD-10-CM | POA: Diagnosis not present

## 2021-10-09 NOTE — Progress Notes (Unsigned)
Enrolled for [employer] to mail a ZIO XT long term holter monitor to the patients address on file.  

## 2021-10-09 NOTE — Telephone Encounter (Signed)
I was asked by our NL office to set up the pt with an Itamar Sleep Study. Pt was seen in the office today by Bernadene Person, NP who ordered the study.   I called and s/w the pt and she is agreeable to come to the [facility]. Location to be set up for [employer]. Pt opted to come in next week June 6th at 11:30. I will update the requesting provider of my call today with the pt.

## 2021-10-09 NOTE — Patient Instructions (Signed)
Medication Instructions:  Your physician recommends that you continue on your current medications as directed. Please refer to the Current Medication list given to you today.   *If you need a refill on your cardiac medications before your next appointment, please call your pharmacy*   Lab Work: NONE ordered at this time of appointment   If you have labs (blood work) drawn today and your tests are completely normal, you will receive your results only by: MyChart Message (if you have MyChart) OR A paper copy in the mail If you have any lab test that is abnormal or we need to change your treatment, we will call you to review the results.   Testing/Procedures: Sleep Study, Adult A sleep study (polysomnogram) is a series of tests done while you are sleeping. A sleep study records your brain waves, heart rate, breathing rate, oxygen level, and eye and leg movements. A sleep study helps your health care provider: See how well you sleep. Diagnose a sleep disorder. Determine how severe your sleep disorder is. Create a plan to treat your sleep disorder. Your health care provider may recommend a sleep study if you: Feel sleepy on most days. Snore loudly while sleeping. Have unusual behaviors while you sleep, such as walking. Have brief periods in which you stop breathing during sleep (sleepapnea). Fall asleep suddenly during the day (narcolepsy). Have trouble falling asleep or staying asleep (insomnia). Feel like you need to move your legs when trying to fall asleep (restless legs syndrome). Move your legs by flexing and extending them regularly while asleep (periodic limb movement disorder). Act out your dreams while you sleep (sleep behavior disorder). Feel like you cannot move when you first wake up (sleep paralysis). What tests are part of a sleep study? Most sleep studies record the following during sleep: Brain activity. Eye movements. Heart rate and rhythm. Breathing rate and  rhythm. Blood-oxygen level. Blood pressure. Chest and belly movement as you breathe. Arm and leg movements. Snoring or other noises. Body position. Where are sleep studies done? Sleep studies are done at sleep centers. A sleep center may be inside a hospital, office, or clinic. The room where you have the study may look like a hospital room or a hotel room. The health care providers doing the study may come in and out of the room during the study. Most of the time, they will be in another room monitoring your test as you sleep. How are sleep studies done? Most sleep studies are done during a normal period of time for a full night of sleep. You will arrive at the study center in the evening and go home in the morning. Before the test Bring your pajamas and toothbrush with you to the sleep study. Do not have caffeine on the day of your sleep study. Do not drink alcohol on the day of your sleep study. Your health care provider will let you know if you should stop taking any of your regular medicines before the test. During the test     Round, sticky patches with sensors attached to recording wires (electrodes) are placed on your scalp, face, chest, and limbs. Wires from all the electrodes and sensors run from your bed to a computer. The wires can be taken off and put back on if you need to get out of bed to go to the bathroom. A sensor is placed over your nose to measure airflow. A finger clip is put on your finger or ear to measure your blood  oxygen level (pulse oximetry). A belt is placed around your belly and a belt is placed around your chest to measure breathing movements. If you have signs of the sleep disorder called sleep apnea during your test, you may get a treatment mask to wear for the second half of the night. The mask provides positive airway pressure (PAP) to help you breathe better during sleep. This may greatly improve your sleep apnea. You will then have all tests done  again with the mask in place to see if your measurements and recordings change. After the test A medical doctor who specializes in sleep will evaluate the results of your sleep study and share them with you and your primary health care provider. Based on your results, your medical history, and a physical exam, you may be diagnosed with a sleep disorder, such as: Sleep apnea. Restless legs syndrome. Sleep-related behavior disorder. Sleep-related movement disorders. Sleep-related seizure disorders. Your health care team will help determine your treatment options based on your diagnosis. This may include: Improving your sleep habits (sleep hygiene). Wearing a continuous positive airway pressure (CPAP) or bi-level positive airway pressure (BIPAP) mask. Wearing an oral device at night to improve breathing and reduce snoring. Taking medicines. Follow these instructions at home: Take over-the-counter and prescription medicines only as told by your health care provider. If you are instructed to use a CPAP or BIPAP mask, make sure you use it nightly as directed. Make any lifestyle changes that your health care provider recommends. If you were given a device to open your airway while you sleep, use it only as told by your health care provider. Do not use any tobacco products, such as cigarettes, chewing tobacco, and e-cigarettes. If you need help quitting, ask your health care provider. Keep all follow-up visits as told by your health care provider. This is important. Summary A sleep study (polysomnogram) is a series of tests done while you are sleeping. It shows how well you sleep. Most sleep studies are done over one full night of sleep. You will arrive at the study center in the evening and go home in the morning. If you have signs of the sleep disorder called sleep apnea during your test, you may get a treatment mask to wear for the second half of the night. A medical doctor who specializes in  sleep will evaluate the results of your sleep study and share them with your primary health care provider. This information is not intended to replace advice given to you by your health care provider. Make sure you discuss any questions you have with your health care provider. Document Revised: 03/12/2021 Document Reviewed: 02/25/2021 Elsevier Patient Education  2023 Elsevier Inc.   Christena Deem- Long Term Monitor Instructions  Your physician has requested you wear a ZIO patch monitor for 14 days.  This is a single patch monitor. [employer] supplies one patch monitor per enrollment. Additional stickers are not available. Please do not apply patch if you will be having a Nuclear Stress Test,  Echocardiogram, Cardiac CT, MRI, or Chest Xray during the period you would be wearing the  monitor. The patch cannot be worn during these tests. You cannot remove and re-apply the  ZIO XT patch monitor.  Your ZIO patch monitor will be mailed 3 day USPS to your address on file. It may take 3-5 days  to receive your monitor after you have been enrolled.  Once you have received your monitor, please review the enclosed instructions. Your monitor  has  already been registered assigning a specific monitor serial # to you.  Billing and Patient Assistance Program Information  We have supplied [employer] with any of your insurance information on file for billing purposes. [employer] offers a sliding scale Patient Assistance Program for patients that do not have  insurance, or whose insurance does not completely cover the cost of the ZIO monitor.  You must apply for the Patient Assistance Program to qualify for this discounted rate.  To apply, please call [employer] at [phone], select option 4, select option 2, ask to apply for  Patient Assistance Program. Meredeth Ide will ask your household income, and how many people  are in your household. They will quote your out-of-pocket cost based on that information.  [employer] will also  be able to set up a 50-month, interest-free payment plan if needed.  Applying the monitor   Shave hair from upper left chest.  Hold abrader disc by orange tab. Rub abrader in 40 strokes over the upper left chest as  indicated in your monitor instructions.  Clean area with 4 enclosed alcohol pads. Let dry.  Apply patch as indicated in monitor instructions. Patch will be placed under collarbone on left  side of chest with arrow pointing upward.  Rub patch adhesive wings for 2 minutes. Remove white label marked "1". Remove the white  label marked "2". Rub patch adhesive wings for 2 additional minutes.  While looking in a mirror, press and release button in center of patch. A small green light will  flash 3-4 times. This will be your only indicator that the monitor has been turned on.  Do not shower for the first 24 hours. You may shower after the first 24 hours.  Press the button if you feel a symptom. You will hear a small click. Record Date, Time and  Symptom in the Patient Logbook.  When you are ready to remove the patch, follow instructions on the last 2 pages of Patient  Logbook. Stick patch monitor onto the last page of Patient Logbook.  Place Patient Logbook in the blue and white box. Use locking tab on box and tape box closed  securely. The blue and white box has prepaid postage on it. Please place it in the mailbox as  soon as possible. Your physician should have your test results approximately 7 days after the  monitor has been mailed back to [employer].  Call [facility] Customer Care at [phone] if you have questions regarding  your ZIO XT patch monitor. Call them immediately if you see an orange light blinking on your  monitor.  If your monitor falls off in less than 4 days, contact our Monitor department at [phone].  If your monitor becomes loose or falls off after 4 days call [employer] at [phone] for  suggestions on securing your monitor     Follow-Up: At [facility], you and your health needs are our priority.  As part of our continuing mission to provide you with exceptional heart care, we have created designated Provider Care Teams.  These Care Teams include your primary Cardiologist (physician) and Advanced Practice Providers (APPs -  Physician Assistants and Nurse Practitioners) who all work together to provide you with the care you need, when you need it.  We recommend signing up for the patient portal called "MyChart".  Sign up information is provided on this After Visit Summary.  MyChart is used to connect with patients for Virtual Visits (Telemedicine).  Patients are able to view lab/test results, encounter  notes, upcoming appointments, etc.  Non-urgent messages can be sent to your provider as well.   To learn more about what you can do with MyChart, go to [email or web address].    Your next appointment:   6 week(s)  The format for your next appointment:   In Person  Provider:   Bernadene PersonEmily Monge, NP        Other Instructions   Important Information About Sugar

## 2021-10-09 NOTE — Telephone Encounter (Signed)
-----   Message from Joylene Grapes, NP sent at 10/09/2021  9:07 AM EDT ----- Thank you guys. We are getting things worked out over here still it seems. We appreciate your help! -EM ----- Message ----- From: Tarri Fuller, CMA Sent: 10/09/2021   8:59 AM EDT To: Joylene Grapes, NP, Lattie Haw, RN, #  Good morning Helmut Muster,  This is a NL pt correct? I will check with [place], as I thought your office was just set up for doing the Itamar studies now as well. If so, you should be able to set her up. If you need help going through the process I can help you with that. I will speak further with [place] about this.   Thank you  Okey Regal  ----- Message ----- From: Lamar Benes, CMA Sent: 10/09/2021   8:33 AM EDT To: Joylene Grapes, NP, Tarri Fuller, CMA, #  Hi, pt was seen by Bernadene Person NP this morning. Irving Burton would like pt to have the Itamar sleep study (WatchPat). Stop Brennan Bailey was completed in chart. Order placed. Thank you.

## 2021-10-09 NOTE — Telephone Encounter (Signed)
Noted! Thank you

## 2021-10-09 NOTE — Telephone Encounter (Signed)
Sandra Huffman set up to be 10/15/21 @ 11:30.

## 2021-10-11 NOTE — Telephone Encounter (Signed)
Will forward this note to [place], [place] and Rico Junker. The pt is coming in 6/6//23 for Itamar set up. I will be out of the office that day. I will let the front desk know that the pt is coming in and they will need to call Claiborne Billings, [place] or Rico Junker. To set the pt up with Itamar study.  I will forward this note to parties mentioned above.   STOPBANG is done. This is a NL pt, Dr. Percival Spanish ordered sleep study. NL office is in the process of being trained on Itamar Sleep Studies.  This pt is coming by 10/15/21 @ 11:30 am

## 2021-10-13 DIAGNOSIS — E782 Mixed hyperlipidemia: Secondary | ICD-10-CM

## 2021-10-13 DIAGNOSIS — R0789 Other chest pain: Secondary | ICD-10-CM

## 2021-10-13 DIAGNOSIS — R002 Palpitations: Secondary | ICD-10-CM | POA: Diagnosis not present

## 2021-10-13 DIAGNOSIS — I10 Essential (primary) hypertension: Secondary | ICD-10-CM

## 2021-10-14 ENCOUNTER — Other Ambulatory Visit (INDEPENDENT_AMBULATORY_CARE_PROVIDER_SITE_OTHER): Payer: BC Managed Care – PPO

## 2021-10-14 DIAGNOSIS — E782 Mixed hyperlipidemia: Secondary | ICD-10-CM | POA: Diagnosis not present

## 2021-10-14 LAB — HEPATIC FUNCTION PANEL
ALT: 35 U/L (ref 0–35)
AST: 30 U/L (ref 0–37)
Albumin: 4.2 g/dL (ref 3.5–5.2)
Alkaline Phosphatase: 75 U/L (ref 39–117)
Bilirubin, Direct: 0.1 mg/dL (ref 0.0–0.3)
Total Bilirubin: 0.7 mg/dL (ref 0.2–1.2)
Total Protein: 8 g/dL (ref 6.0–8.3)

## 2021-10-15 NOTE — Telephone Encounter (Signed)
Itamar device given to patient.  Education given to patient and daughter.  She is aware to not open box until she receives PIN.

## 2021-10-16 NOTE — Telephone Encounter (Signed)
ITAMAR SET UP 10/15/21

## 2021-10-25 DIAGNOSIS — E782 Mixed hyperlipidemia: Secondary | ICD-10-CM | POA: Diagnosis not present

## 2021-10-25 DIAGNOSIS — R0789 Other chest pain: Secondary | ICD-10-CM | POA: Diagnosis not present

## 2021-10-25 DIAGNOSIS — R002 Palpitations: Secondary | ICD-10-CM | POA: Diagnosis not present

## 2021-10-25 DIAGNOSIS — I10 Essential (primary) hypertension: Secondary | ICD-10-CM | POA: Diagnosis not present

## 2021-10-28 ENCOUNTER — Telehealth: Payer: Self-pay

## 2021-10-28 NOTE — Telephone Encounter (Addendum)
Called patient regarding results. Patient had understanding of results.----- Message from Joylene Grapes, NP sent at 10/27/2021  3:08 PM EDT ----- Recent cardiac monitor showed normal sinus rhythm, rare PACs and PVCs, no abnormal heart rhythm, it appears you did not report any symptoms either.  This is good news.  Continue current medications and follow-up as planned.  Thank you. -EM

## 2021-10-28 NOTE — Progress Notes (Signed)
Called aptient SG.

## 2021-10-29 NOTE — Telephone Encounter (Signed)
Message was sent to Burna Mortimer and Coralee North to see if the pt has been approved for Itamar yet. Pt is followed by the NL office, however the Itamar was set up here at [facility]. Location.Marland Kitchen

## 2021-10-30 ENCOUNTER — Telehealth: Payer: Self-pay | Admitting: Nurse Practitioner

## 2021-10-30 ENCOUNTER — Telehealth: Payer: Self-pay | Admitting: *Deleted

## 2021-10-30 MED ORDER — AMLODIPINE BESYLATE 10 MG PO TABS: 10.0000 mg | ORAL_TABLET | Freq: Every day | ORAL | 0 refills | Status: DC

## 2021-10-30 MED ORDER — CARVEDILOL 6.25 MG PO TABS: 6.2500 mg | ORAL_TABLET | Freq: Two times a day (BID) | ORAL | 0 refills | Status: DC

## 2021-10-30 MED ORDER — ATORVASTATIN CALCIUM 40 MG PO TABS: 40.0000 mg | ORAL_TABLET | Freq: Every day | ORAL | 0 refills | Status: DC

## 2021-10-30 NOTE — Telephone Encounter (Signed)
Chart Supports rx Last OV: 08/2021 Ne4xt OV not scheduled

## 2021-10-30 NOTE — Telephone Encounter (Signed)
Prior Authorization for [employer] sent to [employer] via Phone. No PA is required. Call reference # [id number].

## 2021-10-30 NOTE — Telephone Encounter (Signed)
  Encourage patient to contact the pharmacy for refills or they can request refills through [facility]  LAST APPOINTMENT DATE:  Please schedule appointment if longer than 1 year  NEXT APPOINTMENT DATE:  MEDICATION: amLODipine, atorvastatin, and carvedilol  Is the patient out of medication?   PHARMACY:  [id number] [place], [place] - [phone] AT [facility] [address] Phone:  [phone]      Let patient know to contact pharmacy at the end of the day to make sure medication is ready.  Please notify patient to allow 48-72 hours to process  CLINICAL FILLS OUT ALL BELOW:   LAST REFILL:  QTY:  REFILL DATE:    OTHER COMMENTS:    Okay for refill?  Please advise

## 2021-11-01 ENCOUNTER — Encounter (INDEPENDENT_AMBULATORY_CARE_PROVIDER_SITE_OTHER): Payer: BC Managed Care – PPO | Admitting: Cardiology

## 2021-11-01 DIAGNOSIS — I160 Hypertensive urgency: Secondary | ICD-10-CM | POA: Diagnosis not present

## 2021-11-01 DIAGNOSIS — I517 Cardiomegaly: Secondary | ICD-10-CM

## 2021-11-04 ENCOUNTER — Ambulatory Visit: Payer: BC Managed Care – PPO

## 2021-11-04 DIAGNOSIS — E782 Mixed hyperlipidemia: Secondary | ICD-10-CM

## 2021-11-04 DIAGNOSIS — R002 Palpitations: Secondary | ICD-10-CM

## 2021-11-04 DIAGNOSIS — I10 Essential (primary) hypertension: Secondary | ICD-10-CM

## 2021-11-04 DIAGNOSIS — R0789 Other chest pain: Secondary | ICD-10-CM

## 2021-11-06 NOTE — Telephone Encounter (Addendum)
From: Quintella Reichert, MD  Sent: 11/03/2021   8:28 PM EDT  To: [facility]   Normal home sleep study so in lab PSG will be ordered     The patient has been notified of the result. Left detailed message on voicemail and informed patient to call back.Latrelle Dodrill, CMA 09/19/2021 12:08 PM

## 2021-11-13 NOTE — Telephone Encounter (Addendum)
The patient has been notified of the result and verbalized understanding.  All questions (if any) were answered. Latrelle Dodrill, CMA 11/13/2021 5:45 PM    Pt is agreeable to normal results. Patient states her doctor increased her BP meds and gave her cholesterol meds and she has been doing better. Patient declines more testing.

## 2021-11-22 NOTE — Telephone Encounter (Signed)
error 

## 2021-11-24 ENCOUNTER — Other Ambulatory Visit: Payer: Self-pay | Admitting: Nurse Practitioner

## 2021-11-25 ENCOUNTER — Encounter: Payer: Self-pay | Admitting: Nurse Practitioner

## 2021-11-25 ENCOUNTER — Ambulatory Visit (INDEPENDENT_AMBULATORY_CARE_PROVIDER_SITE_OTHER): Payer: BC Managed Care – PPO | Admitting: Nurse Practitioner

## 2021-11-25 VITALS — BP 124/64 | HR 70 | Ht 61.0 in | Wt 134.8 lb

## 2021-11-25 DIAGNOSIS — I10 Essential (primary) hypertension: Secondary | ICD-10-CM | POA: Diagnosis not present

## 2021-11-25 DIAGNOSIS — M25473 Effusion, unspecified ankle: Secondary | ICD-10-CM

## 2021-11-25 DIAGNOSIS — R002 Palpitations: Secondary | ICD-10-CM | POA: Diagnosis not present

## 2021-11-25 DIAGNOSIS — R0789 Other chest pain: Secondary | ICD-10-CM

## 2021-11-25 DIAGNOSIS — R0683 Snoring: Secondary | ICD-10-CM | POA: Diagnosis not present

## 2021-11-25 DIAGNOSIS — E782 Mixed hyperlipidemia: Secondary | ICD-10-CM

## 2021-11-25 MED ORDER — CARVEDILOL 12.5 MG PO TABS: 12.5000 mg | ORAL_TABLET | Freq: Two times a day (BID) | ORAL | 1 refills | Status: DC

## 2021-11-25 MED ORDER — AMLODIPINE BESYLATE 5 MG PO TABS: 5.0000 mg | ORAL_TABLET | Freq: Every day | ORAL | 1 refills | Status: DC

## 2021-11-25 NOTE — Telephone Encounter (Signed)
Chart supports Rx Last OV: 08/2021 Next OV: 11/2021 

## 2021-11-25 NOTE — Progress Notes (Signed)
Office Visit    Patient Name: Sandra Huffman Date of Encounter: 11/25/2021  Primary Care Provider:  Anne Ng, NP Primary Cardiologist:  Rollene Rotunda, MD  Chief Complaint    51 year old female with a history of atypical chest pain, palpitations, hypertension, hyperlipidemia, iron deficiency anemia, and hypokalemia who presents for follow-up related to chest pain and palpitations.   Past Medical History    Past Medical History:  Diagnosis Date   History of chicken pox    History of iron deficiency anemia    HTN (hypertension)    No past surgical history on file.  Allergies  Allergies  Allergen Reactions   Iron Anaphylaxis   Ace Inhibitors     cough    History of Present Illness    51 year old female with the above past medical history including atypical chest pain, palpitations, hypertension, hyperlipidemia, iron deficiency anemia, and hypokalemia.   She presented to the ED on 09/30/2021 with intermittent sharp stabbing chest pain, associated shortness of breath, that woke her from her sleep and resolved spontaneously.  She also reported an episode of sudden onset dizziness with cold sweat, felt like she was going to pass out.  Of note, she was bit by a tick 2 weeks prior. Troponin was negative x2, D-dimer was normal.  Chest x-ray showed cardiomegaly, EKG was unremarkable.  Her potassium was low at 3.4.  Echocardiogram showed EF 55 to 60%, normal LV function, no RWMA, G1 DD, normal RV systolic function, no significant valvular abnormalities.  Coronary CT angiogram showed no evidence of CAD, calcium score of 0.  Metoprolol was switched to carvedilol, and her amlodipine was increased for blood pressure control.  She was seen in the office on 10/09/2021 and reported nightly episodes of feeling like her heart is "pounding," with associated shortness of breath.  Cardiac monitor showed normal sinus rhythm, rare PACs and PVCs, no significant arrhythmia.  Outpatient sleep study showed  no evidence of sleep apnea.   She presents today for follow-up.  Since her last visit he has been stable from a cardiac standpoint.  She denies any recurrent palpitations, denies dyspnea, denies symptoms concerning for angina.  Her only complaint is that since increasing her amlodipine dose she has noticed swelling in her bilateral lower extremities.  Otherwise, she reports feeling well and denies any additional concerns today.  Home Medications    Current Outpatient Medications  Medication Sig Dispense Refill   amLODipine (NORVASC) 5 MG tablet Take 5 mg by mouth daily.     amLODipine (NORVASC) 5 MG tablet Take 1 tablet (5 mg total) by mouth daily. 30 tablet 1   atorvastatin (LIPITOR) 40 MG tablet TAKE 1 TABLET(40 MG) BY MOUTH DAILY 30 tablet 0   Azelastine-Fluticasone 137-50 MCG/ACT SUSP Place 1 spray into the nose every 12 (twelve) hours. 23 g 5   carvedilol (COREG) 12.5 MG tablet Take 1 tablet (12.5 mg total) by mouth 2 (two) times daily. 30 tablet 1   carvedilol (COREG) 25 MG tablet Take 12.5 mg by mouth 2 (two) times daily with a meal.     cetirizine (ZYRTEC) 10 MG tablet TAKE 1 TABLET(10 MG) BY MOUTH AT BEDTIME 90 tablet 3   No current facility-administered medications for this visit.     Review of Systems    She denies chest pain, palpitations, dyspnea, pnd, orthopnea, n, v, dizziness, syncope, weight gain, or early satiety. All other systems reviewed and are otherwise negative except as noted above.   Physical Exam  VS:  BP 124/64   Pulse 70   Ht 5\' 1"  (1.549 m)   Wt 134 lb 12.8 oz (61.1 kg)   SpO2 99%   BMI 25.47 kg/m  , BMI Body mass index is 25.47 kg/m. STOP-Bang Score:  4      GEN: Well nourished, well developed, in no acute distress. HEENT: normal. Neck: Supple, no JVD, carotid bruits, or masses. Cardiac: RRR, no murmurs, rubs, or gallops. No clubbing, cyanosis, edema.  Radials/DP/PT 2+ and equal bilaterally.  Respiratory:  Respirations regular and unlabored,  clear to auscultation bilaterally. GI: Soft, nontender, nondistended, BS + x 4. MS: no deformity or atrophy. Skin: warm and dry, no rash. Neuro:  Strength and sensation are intact. Psych: Normal affect.  Accessory Clinical Findings    ECG personally reviewed by me today -NSR, 70 bpm- no acute changes.  Lab Results  Component Value Date   WBC 7.7 10/01/2021   HGB 13.1 10/01/2021   HCT 40.1 10/01/2021   MCV 89.3 10/01/2021   PLT 224 10/01/2021   Lab Results  Component Value Date   CREATININE 0.69 10/01/2021   BUN 15 10/01/2021   NA 141 10/01/2021   K 3.9 10/01/2021   CL 110 10/01/2021   CO2 25 10/01/2021   Lab Results  Component Value Date   ALT 35 10/14/2021   AST 30 10/14/2021   ALKPHOS 75 10/14/2021   BILITOT 0.7 10/14/2021   Lab Results  Component Value Date   CHOL 258 (H) 09/05/2021   HDL 48.90 09/05/2021   LDLCALC 153 (H) 04/16/2020   LDLDIRECT 163.0 09/05/2021   TRIG 288.0 (H) 09/05/2021   CHOLHDL 5 09/05/2021    No results found for: "HGBA1C"  Assessment & Plan    1. Atypical chest pain: Echo showed EF 55 to 60%, normal LV function, no RWMA, G1 DD, normal RV systolic function, no significant valvular abnormalities. Coronary CT angiogram showed no evidence of CAD, calcium score of 0. Stable with no anginal symptoms.  Continue amlodipine, carvedilol, Lipitor.   2. Palpitations: Cardiac monitor showed PACs, PVCs, otherwise unremarkable. Denies any recent palpitations.  Continue carvedilol.  3. Snoring/daytime fatigue: Sleep study was negative for sleep apnea.  Continue good sleep hygiene.  4. Hypertension/bilateral mild ankle edema: BP well controlled. She notes new dependent bilateral lower extremity edema in the setting of increased amlodipine dose. Will decrease amlodipine to 5 mg daily and increase carvedilol to 12.5 mg bid.  Continue to monitor BP and report BP consistently > 130/80.    5. Hyperlipidemia: Direct LDL was 163 in 08/2021.  Recently started  on Lipitor.  Due for repeat lipids, LFT however, she is not fasting today. Will check labs at next follow-up visit. Continue Lipitor.   6. Disposition: Follow-up in 1 month.   09/2021, NP 11/25/2021, 12:31 PM

## 2021-11-25 NOTE — Patient Instructions (Signed)
Medication Instructions:  Decrease Amlodipine 5 mg daily Increase Carvedilol 12.5 mg twice daily  *If you need a refill on your cardiac medications before your next appointment, please call your pharmacy*   Lab Work: NONE ordered at this time of appointment   If you have labs (blood work) drawn today and your tests are completely normal, you will receive your results only by: MyChart Message (if you have MyChart) OR A paper copy in the mail If you have any lab test that is abnormal or we need to change your treatment, we will call you to review the results.   Testing/Procedures: NONE ordered at this time of appointment     Follow-Up: At [facility], you and your health needs are our priority.  As part of our continuing mission to provide you with exceptional heart care, we have created designated Provider Care Teams.  These Care Teams include your primary Cardiologist (physician) and Advanced Practice Providers (APPs -  Physician Assistants and Nurse Practitioners) who all work together to provide you with the care you need, when you need it.  We recommend signing up for the patient portal called "MyChart".  Sign up information is provided on this After Visit Summary.  MyChart is used to connect with patients for Virtual Visits (Telemedicine).  Patients are able to view lab/test results, encounter notes, upcoming appointments, etc.  Non-urgent messages can be sent to your provider as well.   To learn more about what you can do with MyChart, go to [email or web address].    Your next appointment:   1 month(s)  The format for your next appointment:   In Person  Provider:   Rollene Rotunda, MD     Other Instructions Monitor blood pressure. Report blood pressure consistently greater than 130/80.  Important Information About Sugar

## 2021-11-26 ENCOUNTER — Ambulatory Visit: Payer: BC Managed Care – PPO | Admitting: Family Medicine

## 2021-12-04 ENCOUNTER — Other Ambulatory Visit: Payer: Self-pay | Admitting: Nurse Practitioner

## 2021-12-04 DIAGNOSIS — Z1231 Encounter for screening mammogram for malignant neoplasm of breast: Secondary | ICD-10-CM

## 2021-12-09 ENCOUNTER — Encounter: Payer: Self-pay | Admitting: Nurse Practitioner

## 2021-12-20 ENCOUNTER — Other Ambulatory Visit: Payer: Self-pay | Admitting: Nurse Practitioner

## 2021-12-23 ENCOUNTER — Other Ambulatory Visit: Payer: Self-pay | Admitting: Nurse Practitioner

## 2021-12-23 NOTE — Telephone Encounter (Signed)
Chart supports Rx Last OV: 08/2021 Next OV: not scheduled   

## 2022-01-05 NOTE — Progress Notes (Unsigned)
Office Visit    Patient Name: Sandra Huffman Date of Encounter: 01/06/2022  Primary Care Provider:  Anne Ng, NP Primary Cardiologist:  Rollene Rotunda, MD  Chief Complaint    51 year old female with a history of atypical chest pain, palpitations, hypertension, hyperlipidemia, iron deficiency anemia, and hypokalemia who presents for follow-up related to hypertension.   Past Medical History    Past Medical History:  Diagnosis Date   History of chicken pox    History of iron deficiency anemia    HTN (hypertension)    History reviewed. No pertinent surgical history.  Allergies  Allergies  Allergen Reactions   Iron Anaphylaxis   Ace Inhibitors     cough    History of Present Illness    51 year old female with the above past medical history including atypical chest pain, palpitations, hypertension, hyperlipidemia, iron deficiency anemia, and hypokalemia.   She presented to the ED on 09/30/2021 with intermittent sharp stabbing chest pain, associated shortness of breath, that woke her from her sleep and resolved spontaneously.  She also reported an episode of sudden onset dizziness with cold sweat, felt like she was going to pass out.  Of note, she was bit by a tick 2 weeks prior. Troponin was negative x2, D-dimer was normal.  Chest x-ray showed cardiomegaly, EKG was unremarkable. Her potassium was low at 3.4. Echocardiogram showed EF 55 to 60%, normal LV function, no RWMA, G1 DD, normal RV systolic function, no significant valvular abnormalities.  Coronary CT angiogram showed no evidence of CAD, calcium score of 0.  Metoprolol was switched to carvedilol, and her amlodipine was increased for blood pressure control.  She was seen in the office on 10/09/2021 and reported nightly episodes of feeling like her heart was "pounding," with associated shortness of breath. Cardiac monitor showed normal sinus rhythm, rare PACs and PVCs, no significant arrhythmia. Outpatient sleep study showed no  evidence of sleep apnea.    She was last seen in the office on 11/25/2021 and reported increased bilateral lower extremity edema since increasing amlodipine dose.  Amlodipine was decreased to 5 mg daily.  Carvedilol was increased to 12.5 mg twice daily.  She presents today for follow-up.  Since her last visit she has been stable from a cardiac standpoint.  She did note that since her ED visit she has had 3 episodes where she felt sweaty, lightheaded, and needed to have a bowel movement.  This has not happened in the past month.  She is wondering if it was a side effect of medication possibly.  She denies any palpitations, presyncope, syncope, chest pain, dyspnea.  Her lower extremity edema has resolved.  Other than her episodes of lightheadedness, she denies any additional concerns today.  Home Medications    Current Outpatient Medications  Medication Sig Dispense Refill   Azelastine-Fluticasone 137-50 MCG/ACT SUSP Place 1 spray into the nose every 12 (twelve) hours. 23 g 5   cetirizine (ZYRTEC) 10 MG tablet TAKE 1 TABLET(10 MG) BY MOUTH AT BEDTIME 90 tablet 3   amLODipine (NORVASC) 5 MG tablet Take 1 tablet (5 mg total) by mouth daily. 90 tablet 3   atorvastatin (LIPITOR) 40 MG tablet TAKE 1 TABLET(40 MG) BY MOUTH DAILY 90 tablet 3   carvedilol (COREG) 25 MG tablet Take 0.5 tablets (12.5 mg total) by mouth 2 (two) times daily with a meal. 180 tablet 3   No current facility-administered medications for this visit.     Review of Systems    She denies  chest pain, palpitations, dyspnea, pnd, orthopnea, n, v, dizziness, syncope, edema, weight gain, or early satiety. All other systems reviewed and are otherwise negative except as noted above.   Physical Exam    VS:  BP 126/80   Pulse 66   Ht 5\' 1"  (1.549 m)   Wt 134 lb 12.8 oz (61.1 kg)   SpO2 100%   BMI 25.47 kg/m   STOP-Bang Score:  4      GEN: Well nourished, well developed, in no acute distress. HEENT: normal. Neck: Supple, no JVD,  carotid bruits, or masses. Cardiac: RRR, no murmurs, rubs, or gallops. No clubbing, cyanosis, edema.  Radials/DP/PT 2+ and equal bilaterally.  Respiratory:  Respirations regular and unlabored, clear to auscultation bilaterally. GI: Soft, nontender, nondistended, BS + x 4. MS: no deformity or atrophy. Skin: warm and dry, no rash. Neuro:  Strength and sensation are intact. Psych: Normal affect.  Accessory Clinical Findings    ECG personally reviewed by me today - No EKG in office today.    Lab Results  Component Value Date   WBC 7.7 10/01/2021   HGB 13.1 10/01/2021   HCT 40.1 10/01/2021   MCV 89.3 10/01/2021   PLT 224 10/01/2021   Lab Results  Component Value Date   CREATININE 0.69 10/01/2021   BUN 15 10/01/2021   NA 141 10/01/2021   K 3.9 10/01/2021   CL 110 10/01/2021   CO2 25 10/01/2021   Lab Results  Component Value Date   ALT 35 10/14/2021   AST 30 10/14/2021   ALKPHOS 75 10/14/2021   BILITOT 0.7 10/14/2021   Lab Results  Component Value Date   CHOL 258 (H) 09/05/2021   HDL 48.90 09/05/2021   LDLCALC 153 (H) 04/16/2020   LDLDIRECT 163.0 09/05/2021   TRIG 288.0 (H) 09/05/2021   CHOLHDL 5 09/05/2021    No results found for: "HGBA1C"  Assessment & Plan    1. Hypertension/bilateral mild ankle edema: BP well controlled. Edema resolved. Continue current antihypertensive regimen.   2. Atypical chest pain: Echo in 09/2021 showed EF 55-60%, normal LV function, no RWMA, G1 DD, normal RV systolic function, no significant valvular abnormalities. Coronary CT angiogram showed no evidence of CAD, calcium score of 0. Stable with no anginal symptoms.  Continue amlodipine, carvedilol, Lipitor.   3. Palpitations: Cardiac monitor in 10/2021 showed PACs, PVCs, otherwise unremarkable.  She has noted a few episodes of lightheadedness, diaphoresis, preceded a bowel movement. Denies any recent palpitations, presyncope, syncope.  Cardiac work-up today reassuring.  Question whether this  is a side effect of medication.  She has not had any episodes in the past month.  Continue to monitor symptoms.  Continue carvedilol.    4. Snoring/daytime fatigue: Sleep study was negative for sleep apnea.  Continue good sleep hygiene.   5. Hyperlipidemia: Direct LDL was 163 in 08/2021.  Recently started on Lipitor. Will check lipids, LFTs today. Continue Lipitor.   6. Disposition: Follow-up in 4-6 months with Dr. 09/2021.      Antoine Poche, NP 01/06/2022, 2:18 PM

## 2022-01-06 ENCOUNTER — Encounter: Payer: Self-pay | Admitting: Nurse Practitioner

## 2022-01-06 ENCOUNTER — Ambulatory Visit: Payer: BC Managed Care – PPO | Attending: Nurse Practitioner | Admitting: Nurse Practitioner

## 2022-01-06 VITALS — BP 126/80 | HR 66 | Ht 61.0 in | Wt 134.8 lb

## 2022-01-06 DIAGNOSIS — M25473 Effusion, unspecified ankle: Secondary | ICD-10-CM | POA: Diagnosis not present

## 2022-01-06 DIAGNOSIS — E782 Mixed hyperlipidemia: Secondary | ICD-10-CM | POA: Diagnosis not present

## 2022-01-06 DIAGNOSIS — I10 Essential (primary) hypertension: Secondary | ICD-10-CM

## 2022-01-06 DIAGNOSIS — R0789 Other chest pain: Secondary | ICD-10-CM

## 2022-01-06 DIAGNOSIS — R002 Palpitations: Secondary | ICD-10-CM

## 2022-01-06 DIAGNOSIS — R0683 Snoring: Secondary | ICD-10-CM

## 2022-01-06 MED ORDER — CARVEDILOL 25 MG PO TABS: 12.5000 mg | ORAL_TABLET | Freq: Two times a day (BID) | ORAL | 3 refills | Status: DC

## 2022-01-06 MED ORDER — ATORVASTATIN CALCIUM 40 MG PO TABS: ORAL_TABLET | ORAL | 3 refills | Status: DC

## 2022-01-06 MED ORDER — AMLODIPINE BESYLATE 5 MG PO TABS: 5.0000 mg | ORAL_TABLET | Freq: Every day | ORAL | 3 refills | Status: DC

## 2022-01-06 NOTE — Patient Instructions (Signed)
Medication Instructions:  Your physician recommends that you continue on your current medications as directed. Please refer to the Current Medication list given to you today.   *If you need a refill on your cardiac medications before your next appointment, please call your pharmacy*   Lab Work: Your physician recommends that you complete labs today Fasting Lipid LFTs  If you have labs (blood work) drawn today and your tests are completely normal, you will receive your results only by: MyChart Message (if you have MyChart) OR A paper copy in the mail If you have any lab test that is abnormal or we need to change your treatment, we will call you to review the results.   Testing/Procedures: NONE ordered at this time of appointment     Follow-Up: At [facility], you and your health needs are our priority.  As part of our continuing mission to provide you with exceptional heart care, we have created designated Provider Care Teams.  These Care Teams include your primary Cardiologist (physician) and Advanced Practice Providers (APPs -  Physician Assistants and Nurse Practitioners) who all work together to provide you with the care you need, when you need it.  We recommend signing up for the patient portal called "MyChart".  Sign up information is provided on this After Visit Summary.  MyChart is used to connect with patients for Virtual Visits (Telemedicine).  Patients are able to view lab/test results, encounter notes, upcoming appointments, etc.  Non-urgent messages can be sent to your provider as well.   To learn more about what you can do with MyChart, go to [email or web address].    Your next appointment:   4-6 month(s)  The format for your next appointment:   In Person  Provider:   Rollene Rotunda, MD     Other Instructions   Important Information About Sugar

## 2022-01-07 LAB — HEPATIC FUNCTION PANEL
ALT: 29 IU/L (ref 0–32)
AST: 26 IU/L (ref 0–40)
Albumin: 4.4 g/dL (ref 3.9–4.9)
Alkaline Phosphatase: 89 IU/L (ref 44–121)
Bilirubin Total: 0.8 mg/dL (ref 0.0–1.2)
Bilirubin, Direct: 0.22 mg/dL (ref 0.00–0.40)
Total Protein: 8 g/dL (ref 6.0–8.5)

## 2022-01-07 LAB — LIPID PANEL
Chol/HDL Ratio: 2.7 ratio (ref 0.0–4.4)
Cholesterol, Total: 161 mg/dL (ref 100–199)
HDL: 59 mg/dL (ref >39)
LDL Chol Calc (NIH): 82 mg/dL (ref 0–99)
Triglycerides: 114 mg/dL (ref 0–149)
VLDL Cholesterol Cal: 20 mg/dL (ref 5–40)

## 2022-01-08 ENCOUNTER — Telehealth: Payer: Self-pay

## 2022-01-08 NOTE — Telephone Encounter (Signed)
Spoke with pt. Pt was notified of results. Pt will continue her current medications and follow up as planned.

## 2022-01-24 ENCOUNTER — Other Ambulatory Visit: Payer: Self-pay | Admitting: Nurse Practitioner

## 2022-01-24 NOTE — Telephone Encounter (Signed)
Chart supports Rx Last OV: 08/2021 Next OV: not scheduled   

## 2022-01-27 ENCOUNTER — Ambulatory Visit
Admission: RE | Admit: 2022-01-27 | Discharge: 2022-01-27 | Disposition: A | Discharge status: Home / Self Care | Payer: BC Managed Care – PPO | Source: Ambulatory Visit | Attending: Nurse Practitioner | Admitting: Nurse Practitioner

## 2022-01-27 DIAGNOSIS — Z1231 Encounter for screening mammogram for malignant neoplasm of breast: Secondary | ICD-10-CM | POA: Diagnosis not present

## 2022-01-29 ENCOUNTER — Telehealth: Payer: Self-pay | Admitting: Cardiology

## 2022-01-29 MED ORDER — CARVEDILOL 12.5 MG PO TABS: 12.5000 mg | ORAL_TABLET | Freq: Two times a day (BID) | ORAL | 3 refills | Status: DC

## 2022-01-29 NOTE — Telephone Encounter (Signed)
Pt c/o medication issue:  1. Name of Medication: carvedilol (COREG) 25 MG tablet  2. How are you currently taking this medication (dosage and times per day)? Take 0.5 tablets (12.5 mg total) by mouth 2 (two) times daily with a meal.  3. Are you having a reaction (difficulty breathing--STAT)? no  4. What is your medication issue? Patient she would like to go back to what she had before the medication dosage change. Please advise

## 2022-01-29 NOTE — Telephone Encounter (Signed)
Patient wanted to go back to previous dose of carvedilol because of dizziness. Upon checking her medical record and contacting her pharmacy, patient has a 25mg  tablet that she was taking twice daily for 1 week . Her prescription was for 12.5mg  twice daily. Verified dose with Carmelia Roller, NP. Patient is to take carvedilol 12.5mg  twice daily. Explained to patient that she is to cut the 25mg  tablet in half and take half twice a day. She will split the rest of her 25mg  tablets each day (21 tablets left). New prescription for carvedilol 12.5mg  tablet twice daily sent to her pharmacy. Last SBP 107. Patient repeated back to me in her own words how to take the new prescription of 12.5mg  tablet twice daily.

## 2022-03-05 DIAGNOSIS — I10 Essential (primary) hypertension: Secondary | ICD-10-CM | POA: Diagnosis not present

## 2022-03-05 DIAGNOSIS — Z23 Encounter for immunization: Secondary | ICD-10-CM | POA: Diagnosis not present

## 2022-03-05 DIAGNOSIS — E782 Mixed hyperlipidemia: Secondary | ICD-10-CM | POA: Diagnosis not present

## 2022-03-05 DIAGNOSIS — R0981 Nasal congestion: Secondary | ICD-10-CM | POA: Diagnosis not present

## 2022-05-17 DIAGNOSIS — R002 Palpitations: Secondary | ICD-10-CM | POA: Insufficient documentation

## 2022-05-17 DIAGNOSIS — I10 Essential (primary) hypertension: Secondary | ICD-10-CM | POA: Insufficient documentation

## 2022-05-17 NOTE — Progress Notes (Unsigned)
Cardiology Office Note   Date:  05/19/2022   ID:  Sandra Huffman, DOB November 16, 1970, MRN [id number]  PCP:  Anne Ng, NP  Cardiologist:   Rollene Rotunda, MD   Chief Complaint  Patient presents with   Chest Pain   Palpitations      History of Present Illness: Sandra Huffman is a 52 y.o. female who presents for is being seen for palpitations and pain.  She was in the ED in May 2023.    Troponin was negative x2, D-dimer was normal.  Chest x-ray showed cardiomegaly, EKG was unremarkable. Her potassium was low at 3.4. Echocardiogram showed EF 55 to 60%, normal LV function, no RWMA, G1 DD, normal RV systolic function, no significant valvular abnormalities.  Coronary CT angiogram showed no evidence of CAD, calcium score of 0.  Metoprolol was switched to carvedilol, and her amlodipine was increased for blood pressure control. Outpatient sleep study showed no evidence of sleep apnea. Her beta blocker dose had to be reduced because of dizziness.    Since I last saw her she has done well.  She is exercising on the treadmill and doing some push-ups.  She is not bothered by palpitations.  She is no longer having chest pain.   Past Medical History:  Diagnosis Date   History of chicken pox    History of iron deficiency anemia    HTN (hypertension)     History reviewed. No pertinent surgical history.   Current Outpatient Medications  Medication Sig Dispense Refill   amLODipine (NORVASC) 5 MG tablet Take 1 tablet (5 mg total) by mouth daily. 90 tablet 3   atorvastatin (LIPITOR) 40 MG tablet TAKE 1 TABLET(40 MG) BY MOUTH DAILY 30 tablet 2   Azelastine-Fluticasone 137-50 MCG/ACT SUSP Place 1 spray into the nose every 12 (twelve) hours. 23 g 5   carvedilol (COREG) 12.5 MG tablet Take 1 tablet (12.5 mg total) by mouth 2 (two) times daily. 180 tablet 3   cetirizine (ZYRTEC) 10 MG tablet TAKE 1 TABLET(10 MG) BY MOUTH AT BEDTIME 90 tablet 3   No current facility-administered medications for this visit.     Allergies:   Iron and Ace inhibitors    ROS:  Please see the history of present illness.   Otherwise, review of systems are positive for none.   All other systems are reviewed and negative.    PHYSICAL EXAM: VS:  BP 120/78   Pulse 73   Ht 5\' 1"  (1.549 m)   Wt 136 lb 6.4 oz (61.9 kg)   SpO2 98%   BMI 25.77 kg/m  , BMI Body mass index is 25.77 kg/m. GENERAL:  Well appearing NECK:  No jugular venous distention, waveform within normal limits, carotid upstroke brisk and symmetric, no bruits, no thyromegaly LUNGS:  Clear to auscultation bilaterally CHEST:  Unremarkable HEART:  PMI not displaced or sustained,S1 and S2 within normal limits, no S3, no S4, no clicks, no rubs, no murmurs ABD:  Flat, positive bowel sounds normal in frequency in pitch, no bruits, no rebound, no guarding, no midline pulsatile mass, no hepatomegaly, no splenomegaly EXT:  2 plus pulses throughout, no edema, no cyanosis no clubbing   EKG:  EKG is not ordered today. NA   Recent Labs: 09/30/2021: B Natriuretic Peptide 25.9; Magnesium 1.9; TSH 1.404 10/01/2021: BUN 15; Creatinine, Ser 0.69; Hemoglobin 13.1; Platelets 224; Potassium 3.9; Sodium 141 01/06/2022: ALT 29    Lipid Panel    Component Value Date/Time   CHOL  161 01/06/2022 1413   TRIG 114 01/06/2022 1413   HDL 59 01/06/2022 1413   CHOLHDL 2.7 01/06/2022 1413   CHOLHDL 5 09/05/2021 1343   VLDL 57.6 (H) 09/05/2021 1343   LDLCALC 82 01/06/2022 1413   LDLDIRECT 163.0 09/05/2021 1343      Wt Readings from Last 3 Encounters:  05/19/22 136 lb 6.4 oz (61.9 kg)  01/06/22 134 lb 12.8 oz (61.1 kg)  11/25/21 134 lb 12.8 oz (61.1 kg)      Other studies Reviewed: Additional studies/ records that were reviewed today include: Labs. Review of the above records demonstrates:  Please see elsewhere in the note.     ASSESSMENT AND PLAN:  Hypertension: Blood pressure is well-controlled.  No change in therapy.  Chest pain:  This is not thought to be  anginal.  She has known coronary disease.  No change in therapy.  Palpitations:   She has had PACs and PVCs on monitor.  She has not tolerated increased dose beta blocker.  She is doing well on the lower dose of beta-blockers and has no further palpitations.  No change in therapy.   Current medicines are reviewed at length with the patient today.  The patient does not have concerns regarding medicines.  The following changes have been made:  no change  Labs/ tests ordered today include: None No orders of the defined types were placed in this encounter.    Disposition:   FU with Diona Browner NP in one year at the patient's request.     Signed, Minus Breeding, MD  05/19/2022 9:02 AM    [place] [place]

## 2022-05-19 ENCOUNTER — Ambulatory Visit: Payer: BC Managed Care – PPO | Attending: Cardiology | Admitting: Cardiology

## 2022-05-19 ENCOUNTER — Encounter: Payer: Self-pay | Admitting: Cardiology

## 2022-05-19 VITALS — BP 120/78 | HR 73 | Ht 61.0 in | Wt 136.4 lb

## 2022-05-19 DIAGNOSIS — I10 Essential (primary) hypertension: Secondary | ICD-10-CM

## 2022-05-19 DIAGNOSIS — R072 Precordial pain: Secondary | ICD-10-CM | POA: Diagnosis not present

## 2022-05-19 DIAGNOSIS — R002 Palpitations: Secondary | ICD-10-CM

## 2022-05-19 NOTE — Patient Instructions (Signed)
    Follow-Up: At [facility], you and your health needs are our priority.  As part of our continuing mission to provide you with exceptional heart care, we have created designated Provider Care Teams.  These Care Teams include your primary Cardiologist (physician) and Advanced Practice Providers (APPs -  Physician Assistants and Nurse Practitioners) who all work together to provide you with the care you need, when you need it.  We recommend signing up for the patient portal called "MyChart".  Sign up information is provided on this After Visit Summary.  MyChart is used to connect with patients for Virtual Visits (Telemedicine).  Patients are able to view lab/test results, encounter notes, upcoming appointments, etc.  Non-urgent messages can be sent to your provider as well.   To learn more about what you can do with MyChart, go to [email or web address].    Your next appointment:   12 month(s)  The format for your next appointment:   In Person  Provider:   Diona Browner NP

## 2022-05-23 ENCOUNTER — Other Ambulatory Visit: Payer: Self-pay

## 2022-05-23 DIAGNOSIS — J302 Other seasonal allergic rhinitis: Secondary | ICD-10-CM

## 2022-05-23 DIAGNOSIS — J324 Chronic pansinusitis: Secondary | ICD-10-CM

## 2022-05-23 MED ORDER — AMLODIPINE BESYLATE 5 MG PO TABS: 5.0000 mg | ORAL_TABLET | Freq: Every day | ORAL | 3 refills | Status: DC

## 2022-05-23 MED ORDER — CARVEDILOL 12.5 MG PO TABS: 12.5000 mg | ORAL_TABLET | Freq: Two times a day (BID) | ORAL | 3 refills | Status: DC

## 2022-05-23 MED ORDER — ATORVASTATIN CALCIUM 40 MG PO TABS: 40.0000 mg | ORAL_TABLET | Freq: Every day | ORAL | 3 refills | Status: DC

## 2022-05-23 MED ORDER — CETIRIZINE HCL 10 MG PO TABS: ORAL_TABLET | ORAL | 3 refills | Status: AC

## 2022-08-20 ENCOUNTER — Other Ambulatory Visit: Payer: Self-pay

## 2022-08-20 MED ORDER — CARVEDILOL 12.5 MG PO TABS: 12.5000 mg | ORAL_TABLET | Freq: Two times a day (BID) | ORAL | 3 refills | Status: AC

## 2022-08-22 ENCOUNTER — Telehealth: Payer: Self-pay

## 2022-08-22 MED ORDER — AMLODIPINE BESYLATE 5 MG PO TABS: 5.0000 mg | ORAL_TABLET | Freq: Every day | ORAL | 0 refills | Status: DC

## 2022-08-22 NOTE — Telephone Encounter (Signed)
Received a medication refill for amlodipine and she is due for a visit.  She will need to make an appointment for further refills.

## 2022-12-08 ENCOUNTER — Other Ambulatory Visit: Payer: Self-pay | Admitting: Nurse Practitioner

## 2022-12-08 DIAGNOSIS — Z1231 Encounter for screening mammogram for malignant neoplasm of breast: Secondary | ICD-10-CM

## 2022-12-10 ENCOUNTER — Telehealth: Payer: Self-pay | Admitting: Cardiology

## 2022-12-10 NOTE — Telephone Encounter (Signed)
Pt states that she has been having what feels like a "pinch" on her left side near her arm pit. She would like a callback regarding this matter. Please advise

## 2022-12-10 NOTE — Telephone Encounter (Signed)
Spoke with pt, aware she would need to call pcp, calcium score=0

## 2022-12-10 NOTE — Telephone Encounter (Signed)
Called pt to let her know that her message will be forwarded to the provider and his nurse for recommendation. Patient is not have any symptoms with the "pinch" she feels. She is just concerned because it is on the left side.

## 2022-12-17 ENCOUNTER — Other Ambulatory Visit: Payer: Self-pay | Admitting: Nurse Practitioner

## 2022-12-18 ENCOUNTER — Other Ambulatory Visit: Payer: Self-pay

## 2022-12-18 MED ORDER — AMLODIPINE BESYLATE 5 MG PO TABS: 5.0000 mg | ORAL_TABLET | Freq: Every day | ORAL | 0 refills | Status: DC

## 2023-01-19 ENCOUNTER — Encounter: Payer: BC Managed Care – PPO | Admitting: Nurse Practitioner

## 2023-02-02 ENCOUNTER — Other Ambulatory Visit: Payer: Self-pay | Admitting: Family Medicine

## 2023-02-02 ENCOUNTER — Ambulatory Visit
Admission: RE | Admit: 2023-02-02 | Discharge: 2023-02-02 | Disposition: A | Discharge status: Home / Self Care | Payer: BC Managed Care – PPO | Source: Ambulatory Visit | Attending: Nurse Practitioner | Admitting: Nurse Practitioner

## 2023-02-02 DIAGNOSIS — Z1231 Encounter for screening mammogram for malignant neoplasm of breast: Secondary | ICD-10-CM

## 2023-02-23 DIAGNOSIS — E782 Mixed hyperlipidemia: Secondary | ICD-10-CM | POA: Diagnosis not present

## 2023-02-23 DIAGNOSIS — Z1330 Encounter for screening examination for mental health and behavioral disorders, unspecified: Secondary | ICD-10-CM | POA: Diagnosis not present

## 2023-02-23 DIAGNOSIS — Z6826 Body mass index (BMI) 26.0-26.9, adult: Secondary | ICD-10-CM | POA: Diagnosis not present

## 2023-02-23 DIAGNOSIS — Z0000 Encounter for general adult medical examination without abnormal findings: Secondary | ICD-10-CM | POA: Diagnosis not present

## 2023-04-30 IMAGING — MG MM DIGITAL SCREENING BILAT W/ TOMO AND CAD
8 series · 9 of 24 positions shown · non-contrast
Comparison: Previous exam(s).

CLINICAL DATA: Screening.

EXAM:
DIGITAL SCREENING BILATERAL MAMMOGRAM WITH TOMOSYNTHESIS AND CAD
TECHNIQUE: Bilateral screening digital craniocaudal and mediolateral oblique
mammograms were obtained. Bilateral screening digital breast
tomosynthesis was performed. The images were evaluated with
computer-aided detection.

[L MLO synth-2D]
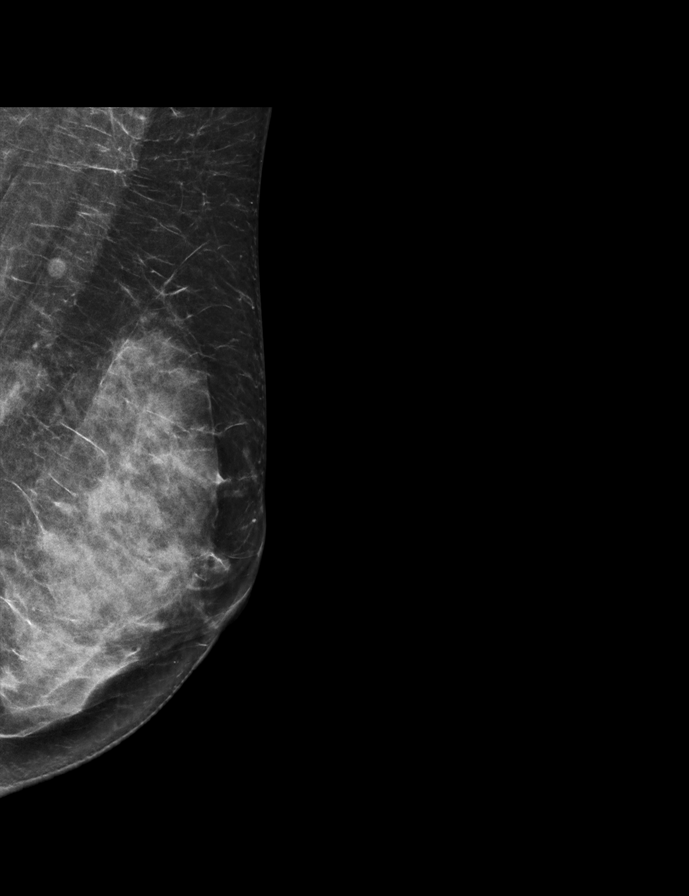

[L CC synth-2D]
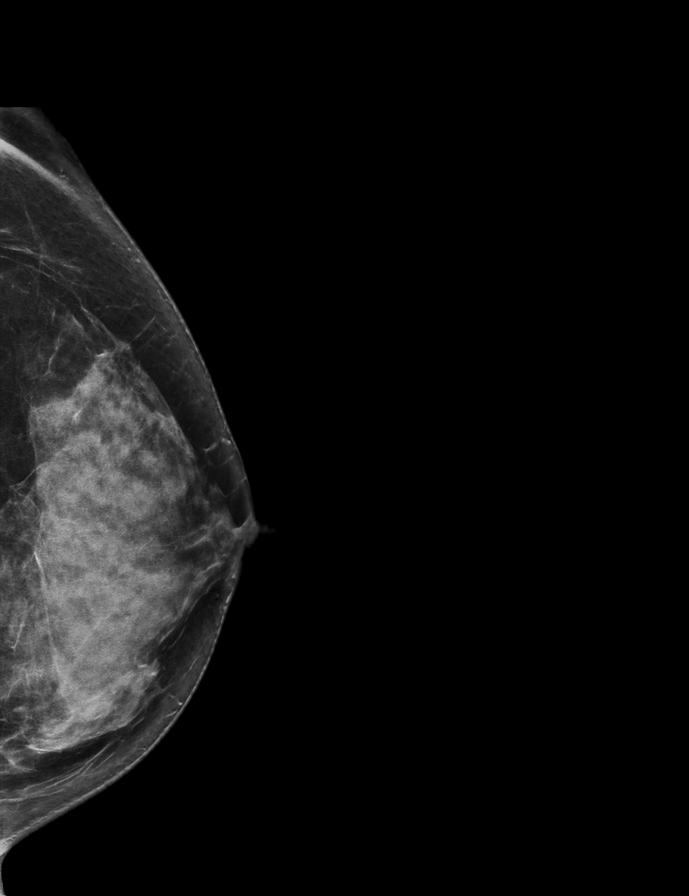

[R MLO synth-2D]
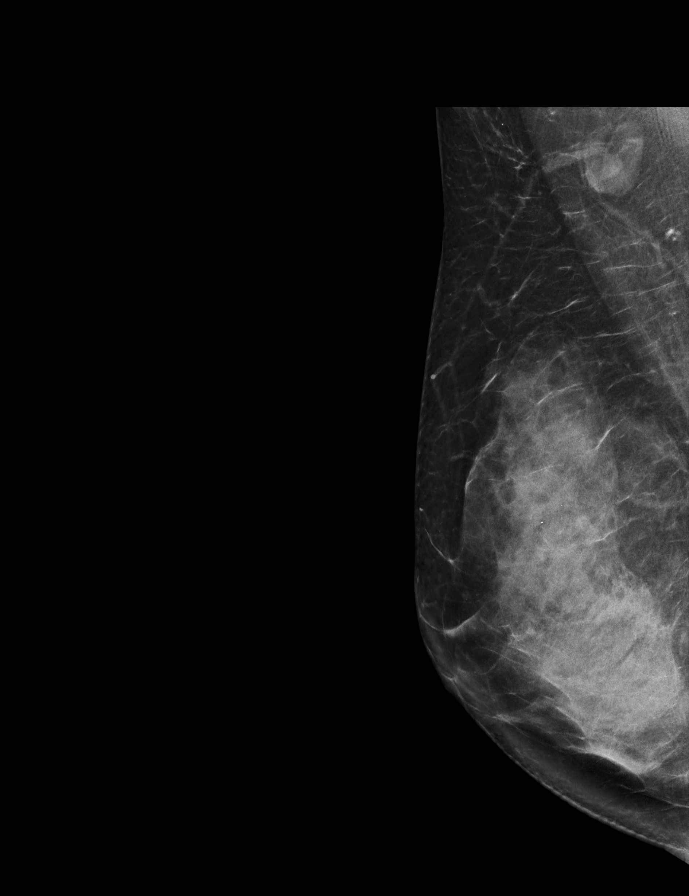

[R CC synth-2D]
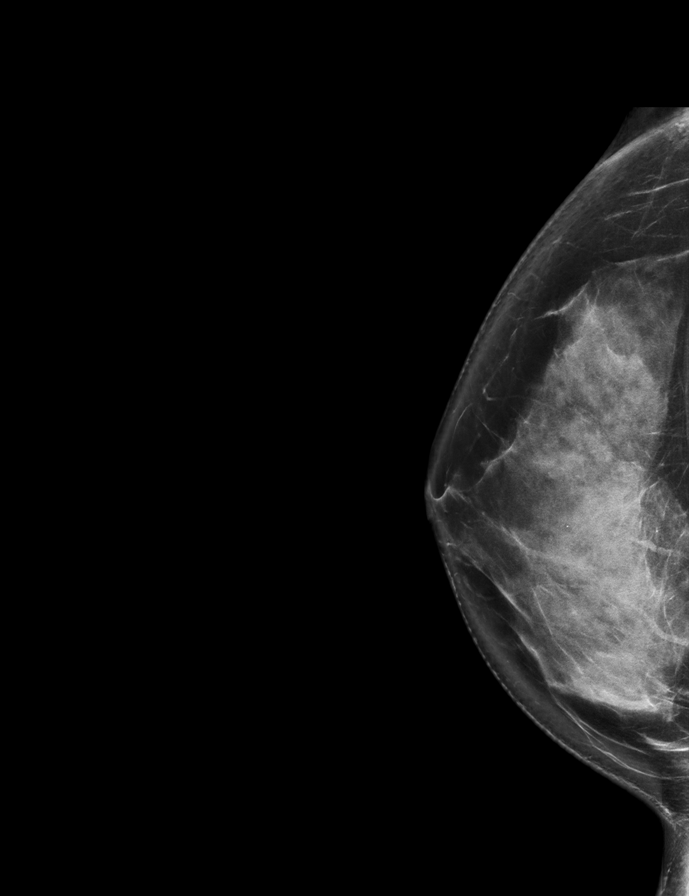

[L MLO tomo · 2 of 63 frames shown]
[frame 21/63]
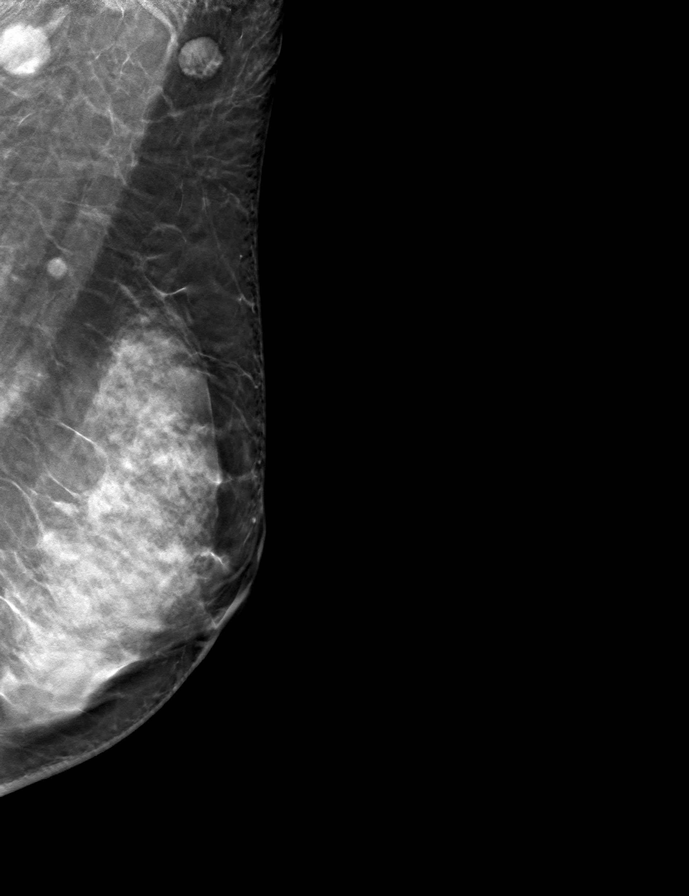
[frame 32/63]
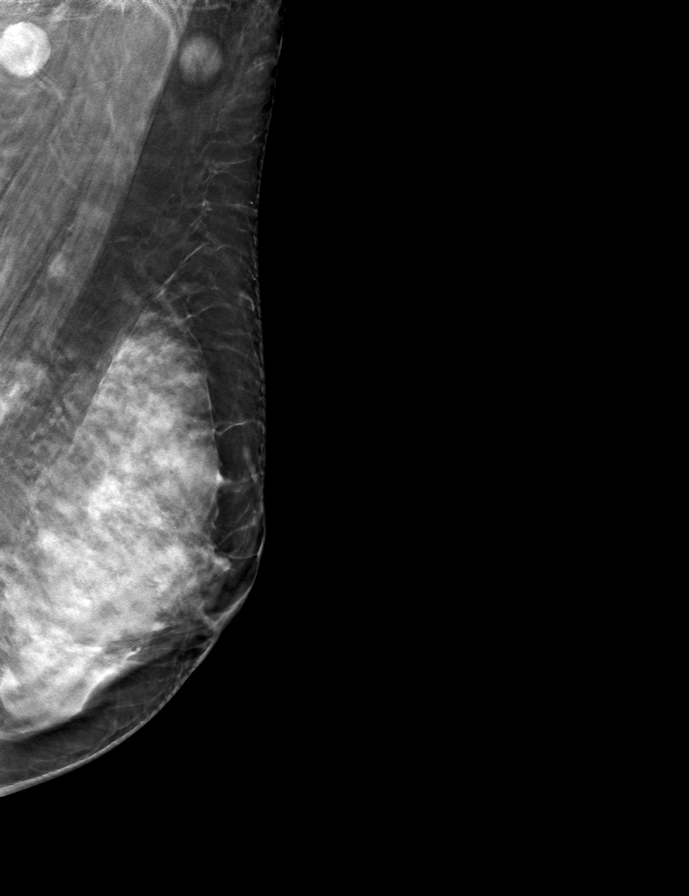

[R MLO tomo · tomo slice 36/71.0]
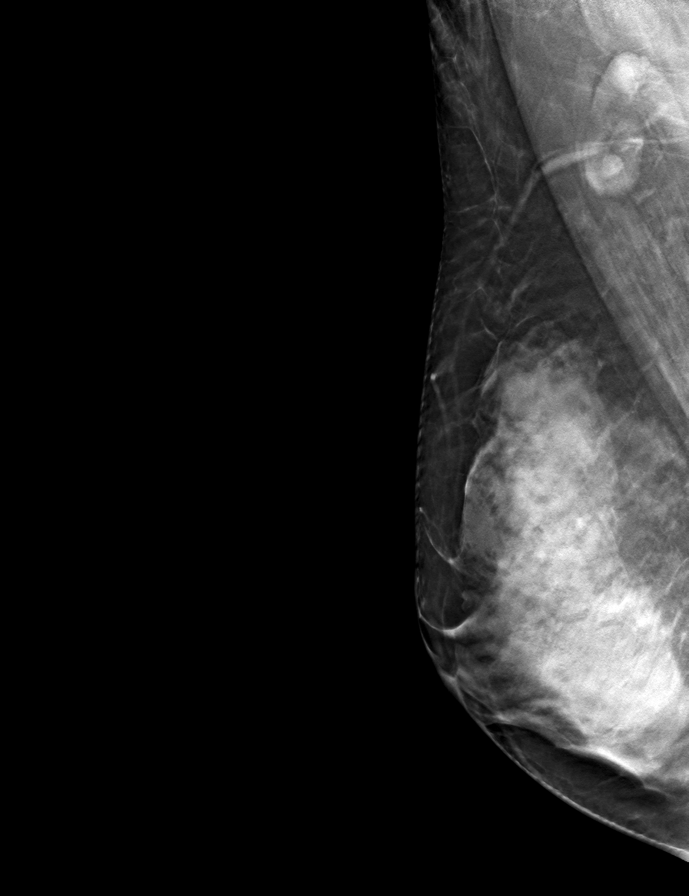

[R CC tomo · tomo slice 39/78.0]
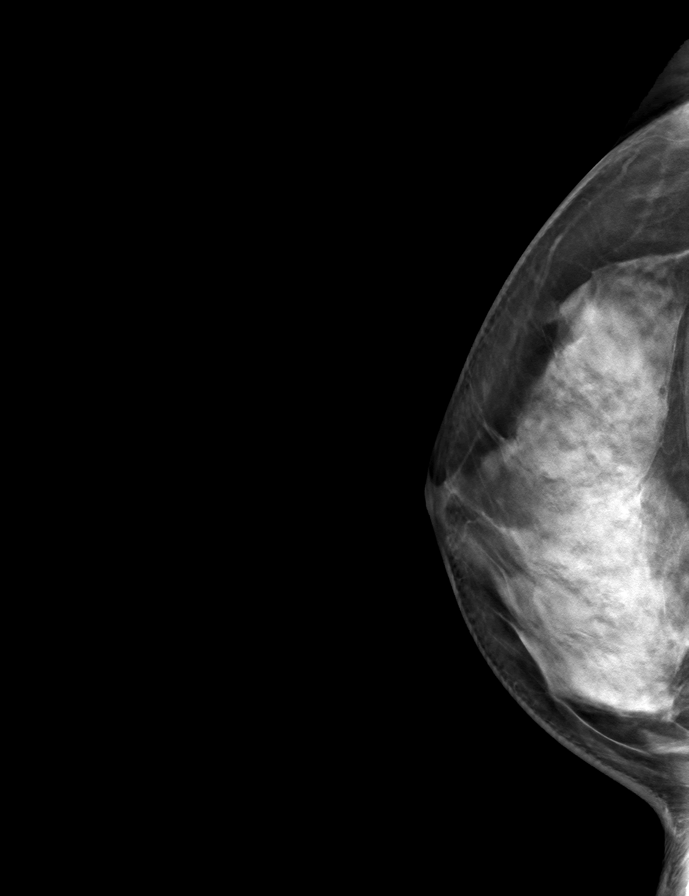

[L CC tomo · tomo slice 33/66.0]
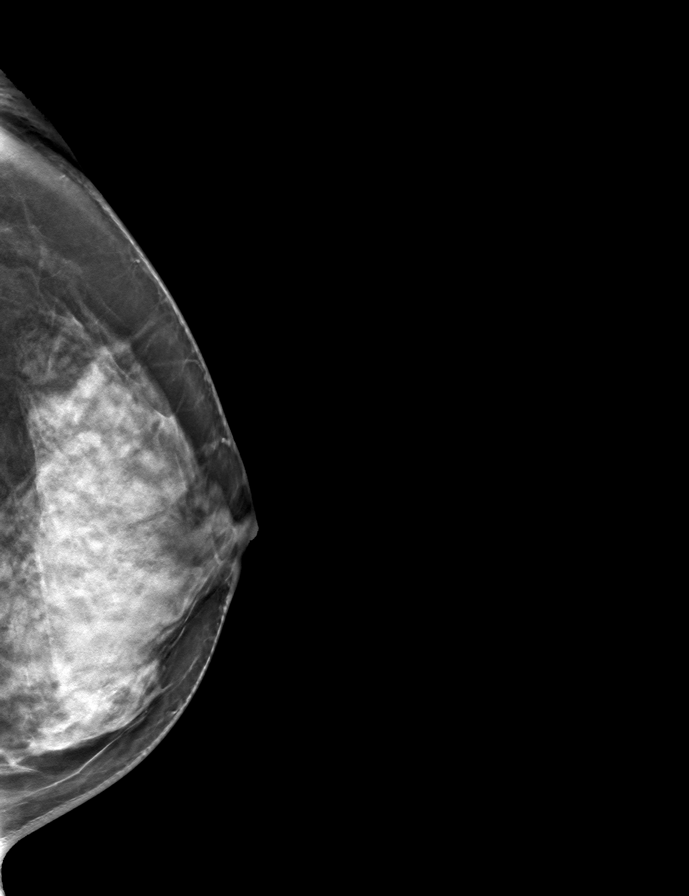

[9 of 24 positions shown; findings below may reference images not displayed]

ACR Breast Density Category d: The breast tissue is extremely dense,
which lowers the sensitivity of mammography
FINDINGS: There are no findings suspicious for malignancy.
IMPRESSION: No mammographic evidence of malignancy. A result letter of this
screening mammogram will be mailed directly to the patient.

RECOMMENDATION:
Screening mammogram in one year. (Code:[id number])

BI-RADS CATEGORY  1: Negative.

## 2023-06-03 ENCOUNTER — Other Ambulatory Visit: Payer: Self-pay | Admitting: Cardiology

## 2023-06-08 ENCOUNTER — Other Ambulatory Visit: Payer: Self-pay | Admitting: Cardiology

## 2024-01-27 ENCOUNTER — Other Ambulatory Visit: Payer: Self-pay | Admitting: Family Medicine

## 2024-01-27 DIAGNOSIS — Z1231 Encounter for screening mammogram for malignant neoplasm of breast: Secondary | ICD-10-CM

## 2024-02-04 ENCOUNTER — Ambulatory Visit
Admission: RE | Admit: 2024-02-04 | Discharge: 2024-02-04 | Disposition: A | Discharge status: Home / Self Care | Source: Ambulatory Visit | Attending: Family Medicine | Admitting: Family Medicine

## 2024-02-04 DIAGNOSIS — Z1231 Encounter for screening mammogram for malignant neoplasm of breast: Secondary | ICD-10-CM
